# Patient Record
Sex: Female | Born: 1965 | Race: White | Hispanic: No | Smoking: Current every day smoker
Health system: Southern US, Community
[De-identification: ages and names within clinical notes are randomized; demographics above are authoritative.]

## PROBLEM LIST (undated history)

## (undated) DIAGNOSIS — I341 Nonrheumatic mitral (valve) prolapse: Secondary | ICD-10-CM

## (undated) DIAGNOSIS — F419 Anxiety disorder, unspecified: Secondary | ICD-10-CM

## (undated) DIAGNOSIS — F319 Bipolar disorder, unspecified: Secondary | ICD-10-CM

## (undated) DIAGNOSIS — S0300XA Dislocation of jaw, unspecified side, initial encounter: Secondary | ICD-10-CM

## (undated) HISTORY — PX: ABDOMINAL HYSTERECTOMY: SHX81

## (undated) HISTORY — PX: ABLATION ON ENDOMETRIOSIS: SHX5787

## (undated) HISTORY — PX: KNEE SURGERY: SHX244

## (undated) HISTORY — PX: LAPAROSCOPIC CHOLECYSTECTOMY: SUR755

---

## 2004-06-29 ENCOUNTER — Emergency Department: Payer: Self-pay | Admitting: Emergency Medicine

## 2004-07-07 ENCOUNTER — Ambulatory Visit: Payer: Self-pay | Admitting: Pain Medicine

## 2004-07-15 ENCOUNTER — Ambulatory Visit: Payer: Self-pay | Admitting: Pain Medicine

## 2004-07-17 ENCOUNTER — Ambulatory Visit: Payer: Self-pay | Admitting: Pain Medicine

## 2004-07-17 ENCOUNTER — Ambulatory Visit: Payer: Self-pay | Admitting: Physician Assistant

## 2004-07-28 ENCOUNTER — Ambulatory Visit: Payer: Self-pay | Admitting: Physician Assistant

## 2004-08-23 ENCOUNTER — Other Ambulatory Visit: Payer: Self-pay

## 2004-08-23 ENCOUNTER — Emergency Department: Payer: Self-pay | Admitting: Emergency Medicine

## 2004-08-25 ENCOUNTER — Ambulatory Visit: Payer: Self-pay | Admitting: Physician Assistant

## 2004-09-16 ENCOUNTER — Ambulatory Visit: Payer: Self-pay | Admitting: Pain Medicine

## 2004-09-22 ENCOUNTER — Ambulatory Visit: Payer: Self-pay | Admitting: Pain Medicine

## 2004-09-30 ENCOUNTER — Ambulatory Visit: Payer: Self-pay | Admitting: Physician Assistant

## 2004-10-22 ENCOUNTER — Encounter: Payer: Self-pay | Admitting: Pain Medicine

## 2004-11-12 ENCOUNTER — Encounter: Payer: Self-pay | Admitting: Pain Medicine

## 2004-12-22 ENCOUNTER — Ambulatory Visit: Payer: Self-pay | Admitting: Physician Assistant

## 2005-01-06 ENCOUNTER — Ambulatory Visit: Payer: Self-pay | Admitting: Pain Medicine

## 2005-02-17 ENCOUNTER — Emergency Department: Payer: Self-pay | Admitting: Emergency Medicine

## 2005-04-03 ENCOUNTER — Emergency Department: Payer: Self-pay | Admitting: Emergency Medicine

## 2005-04-21 ENCOUNTER — Ambulatory Visit: Payer: Self-pay | Admitting: Physician Assistant

## 2005-05-25 ENCOUNTER — Emergency Department: Payer: Self-pay | Admitting: Emergency Medicine

## 2005-05-28 ENCOUNTER — Emergency Department: Payer: Self-pay | Admitting: Emergency Medicine

## 2005-05-28 ENCOUNTER — Other Ambulatory Visit: Payer: Self-pay

## 2005-07-06 ENCOUNTER — Emergency Department: Payer: Self-pay | Admitting: Emergency Medicine

## 2005-07-09 ENCOUNTER — Other Ambulatory Visit: Payer: Self-pay

## 2005-07-09 ENCOUNTER — Observation Stay: Payer: Self-pay | Admitting: Internal Medicine

## 2005-08-17 ENCOUNTER — Ambulatory Visit: Payer: Self-pay | Admitting: Physician Assistant

## 2005-08-25 ENCOUNTER — Ambulatory Visit: Payer: Self-pay | Admitting: Pain Medicine

## 2005-09-22 ENCOUNTER — Ambulatory Visit: Payer: Self-pay | Admitting: Physician Assistant

## 2005-09-29 ENCOUNTER — Ambulatory Visit: Payer: Self-pay | Admitting: Pain Medicine

## 2005-10-12 ENCOUNTER — Ambulatory Visit: Payer: Self-pay | Admitting: Physician Assistant

## 2005-10-21 ENCOUNTER — Ambulatory Visit: Payer: Self-pay | Admitting: Family Medicine

## 2005-10-29 ENCOUNTER — Ambulatory Visit: Payer: Self-pay | Admitting: Pain Medicine

## 2005-10-30 ENCOUNTER — Ambulatory Visit: Payer: Self-pay | Admitting: Physician Assistant

## 2005-11-13 ENCOUNTER — Ambulatory Visit: Payer: Self-pay | Admitting: Physician Assistant

## 2005-11-19 ENCOUNTER — Ambulatory Visit: Payer: Self-pay | Admitting: Pain Medicine

## 2005-12-14 ENCOUNTER — Ambulatory Visit: Payer: Self-pay | Admitting: Physician Assistant

## 2006-01-13 ENCOUNTER — Emergency Department: Payer: Self-pay | Admitting: Emergency Medicine

## 2006-01-16 ENCOUNTER — Emergency Department: Payer: Self-pay | Admitting: Emergency Medicine

## 2006-02-02 ENCOUNTER — Ambulatory Visit: Payer: Self-pay | Admitting: Physician Assistant

## 2006-02-10 ENCOUNTER — Emergency Department: Payer: Self-pay | Admitting: Emergency Medicine

## 2006-03-23 ENCOUNTER — Ambulatory Visit: Payer: Self-pay | Admitting: Physician Assistant

## 2006-04-05 ENCOUNTER — Ambulatory Visit: Payer: Self-pay | Admitting: Physician Assistant

## 2006-06-16 ENCOUNTER — Emergency Department: Payer: Self-pay | Admitting: Emergency Medicine

## 2006-06-30 ENCOUNTER — Ambulatory Visit: Payer: Self-pay | Admitting: Physician Assistant

## 2006-07-12 ENCOUNTER — Ambulatory Visit: Payer: Self-pay | Admitting: Physician Assistant

## 2006-07-19 ENCOUNTER — Ambulatory Visit: Payer: Self-pay | Admitting: Pain Medicine

## 2006-07-22 ENCOUNTER — Ambulatory Visit: Payer: Self-pay | Admitting: Pain Medicine

## 2006-08-09 ENCOUNTER — Ambulatory Visit: Payer: Self-pay | Admitting: Physician Assistant

## 2006-08-17 ENCOUNTER — Ambulatory Visit: Payer: Self-pay | Admitting: Pain Medicine

## 2006-09-01 ENCOUNTER — Ambulatory Visit: Payer: Self-pay | Admitting: Pain Medicine

## 2006-09-23 ENCOUNTER — Ambulatory Visit: Payer: Self-pay | Admitting: Pain Medicine

## 2006-11-08 ENCOUNTER — Ambulatory Visit: Payer: Self-pay | Admitting: Physician Assistant

## 2006-12-17 ENCOUNTER — Ambulatory Visit: Payer: Self-pay | Admitting: Physician Assistant

## 2006-12-20 ENCOUNTER — Ambulatory Visit: Payer: Self-pay | Admitting: Physician Assistant

## 2007-01-10 ENCOUNTER — Ambulatory Visit: Payer: Self-pay | Admitting: Physician Assistant

## 2007-01-14 ENCOUNTER — Emergency Department: Payer: Self-pay | Admitting: Emergency Medicine

## 2007-03-07 ENCOUNTER — Ambulatory Visit: Payer: Self-pay | Admitting: Physician Assistant

## 2007-06-06 ENCOUNTER — Ambulatory Visit: Payer: Self-pay | Admitting: Physician Assistant

## 2007-08-15 ENCOUNTER — Emergency Department: Payer: Self-pay | Admitting: Emergency Medicine

## 2007-08-29 ENCOUNTER — Ambulatory Visit: Payer: Self-pay | Admitting: Physician Assistant

## 2007-09-10 ENCOUNTER — Emergency Department: Payer: Self-pay | Admitting: Emergency Medicine

## 2007-10-22 ENCOUNTER — Emergency Department: Payer: Self-pay | Admitting: Emergency Medicine

## 2007-10-29 ENCOUNTER — Emergency Department: Payer: Self-pay | Admitting: Internal Medicine

## 2007-11-28 ENCOUNTER — Ambulatory Visit: Payer: Self-pay | Admitting: Physician Assistant

## 2008-02-29 ENCOUNTER — Ambulatory Visit: Payer: Self-pay | Admitting: Physician Assistant

## 2008-05-30 ENCOUNTER — Ambulatory Visit: Payer: Self-pay | Admitting: Physician Assistant

## 2008-08-29 ENCOUNTER — Ambulatory Visit: Payer: Self-pay | Admitting: Physician Assistant

## 2008-09-26 ENCOUNTER — Emergency Department: Payer: Self-pay | Admitting: Emergency Medicine

## 2008-10-30 ENCOUNTER — Emergency Department: Payer: Self-pay | Admitting: Emergency Medicine

## 2008-11-27 ENCOUNTER — Ambulatory Visit: Payer: Self-pay | Admitting: Physician Assistant

## 2008-12-25 ENCOUNTER — Ambulatory Visit: Payer: Self-pay | Admitting: Pain Medicine

## 2009-02-05 ENCOUNTER — Ambulatory Visit: Payer: Self-pay | Admitting: Physician Assistant

## 2009-05-29 ENCOUNTER — Ambulatory Visit: Payer: Self-pay | Admitting: Physician Assistant

## 2009-06-20 ENCOUNTER — Emergency Department: Payer: Self-pay | Admitting: Unknown Physician Specialty

## 2009-07-11 ENCOUNTER — Emergency Department (HOSPITAL_COMMUNITY): Admission: EM | Admit: 2009-07-11 | Discharge: 2009-07-11 | Payer: Self-pay | Admitting: Emergency Medicine

## 2009-08-03 ENCOUNTER — Emergency Department: Payer: Self-pay | Admitting: Emergency Medicine

## 2009-08-27 ENCOUNTER — Ambulatory Visit: Payer: Self-pay | Admitting: Physician Assistant

## 2009-11-25 ENCOUNTER — Ambulatory Visit: Payer: Self-pay | Admitting: Pain Medicine

## 2010-02-09 ENCOUNTER — Emergency Department: Payer: Self-pay

## 2010-02-24 ENCOUNTER — Ambulatory Visit: Payer: Self-pay | Admitting: Pain Medicine

## 2010-02-24 ENCOUNTER — Emergency Department: Payer: Self-pay | Admitting: Emergency Medicine

## 2010-03-06 ENCOUNTER — Ambulatory Visit: Payer: Self-pay | Admitting: Pain Medicine

## 2010-03-31 ENCOUNTER — Emergency Department: Payer: Self-pay | Admitting: Unknown Physician Specialty

## 2010-04-07 ENCOUNTER — Emergency Department: Payer: Self-pay | Admitting: Emergency Medicine

## 2010-04-20 ENCOUNTER — Emergency Department: Payer: Self-pay | Admitting: Internal Medicine

## 2010-05-13 ENCOUNTER — Emergency Department: Payer: Self-pay | Admitting: Emergency Medicine

## 2010-07-24 ENCOUNTER — Ambulatory Visit: Payer: Self-pay | Admitting: Pain Medicine

## 2010-08-11 ENCOUNTER — Ambulatory Visit: Payer: Self-pay | Admitting: Pain Medicine

## 2010-08-19 ENCOUNTER — Ambulatory Visit: Payer: Self-pay | Admitting: Pain Medicine

## 2010-09-01 ENCOUNTER — Ambulatory Visit: Payer: Self-pay | Admitting: Pain Medicine

## 2010-09-05 ENCOUNTER — Ambulatory Visit: Payer: Self-pay | Admitting: Pain Medicine

## 2010-09-12 ENCOUNTER — Emergency Department: Payer: Self-pay | Admitting: Emergency Medicine

## 2010-09-18 ENCOUNTER — Ambulatory Visit: Payer: Self-pay | Admitting: Pain Medicine

## 2010-12-18 LAB — URINALYSIS, ROUTINE W REFLEX MICROSCOPIC
Bilirubin Urine: NEGATIVE
Nitrite: NEGATIVE
Protein, ur: NEGATIVE mg/dL
Urobilinogen, UA: 0.2 mg/dL (ref 0.0–1.0)

## 2010-12-18 LAB — URINE MICROSCOPIC-ADD ON

## 2010-12-29 ENCOUNTER — Emergency Department: Payer: Self-pay | Admitting: Internal Medicine

## 2010-12-31 ENCOUNTER — Emergency Department: Payer: Self-pay | Admitting: Emergency Medicine

## 2011-03-21 ENCOUNTER — Emergency Department: Payer: Self-pay | Admitting: Emergency Medicine

## 2011-06-07 ENCOUNTER — Emergency Department: Payer: Self-pay | Admitting: Unknown Physician Specialty

## 2011-06-15 ENCOUNTER — Emergency Department: Payer: Self-pay | Admitting: Emergency Medicine

## 2011-08-19 ENCOUNTER — Emergency Department: Payer: Self-pay | Admitting: Emergency Medicine

## 2011-09-01 ENCOUNTER — Emergency Department: Payer: Self-pay | Admitting: Emergency Medicine

## 2012-01-05 ENCOUNTER — Emergency Department: Payer: Self-pay | Admitting: Emergency Medicine

## 2012-01-05 LAB — COMPREHENSIVE METABOLIC PANEL
Albumin: 3.6 g/dL (ref 3.4–5.0)
BUN: 8 mg/dL (ref 7–18)
Calcium, Total: 9.2 mg/dL (ref 8.5–10.1)
EGFR (Non-African Amer.): >60
Osmolality: 282 (ref 275–301)
SGOT(AST): 31 U/L (ref 15–37)
Total Protein: 7.7 g/dL (ref 6.4–8.2)

## 2012-01-05 LAB — CBC
HGB: 12.5 g/dL (ref 12.0–16.0)
RBC: 4.15 10*6/uL (ref 3.80–5.20)

## 2012-03-19 IMAGING — CR DG CHEST 2V
1 series · 2 of 2 positions shown · non-contrast
Comparison: none

REASON FOR EXAM: chest pain
COMMENTS:   May transport without cardiac monitor

PROCEDURE:     DXR - DXR CHEST PA (OR AP) AND LATERAL  - April 20, 2010  [DATE]
RESULT:     Comparison: None

[Series 1: view not recorded · 0.17mm/px · 2 of 2 slices shown]
[im 1/2]
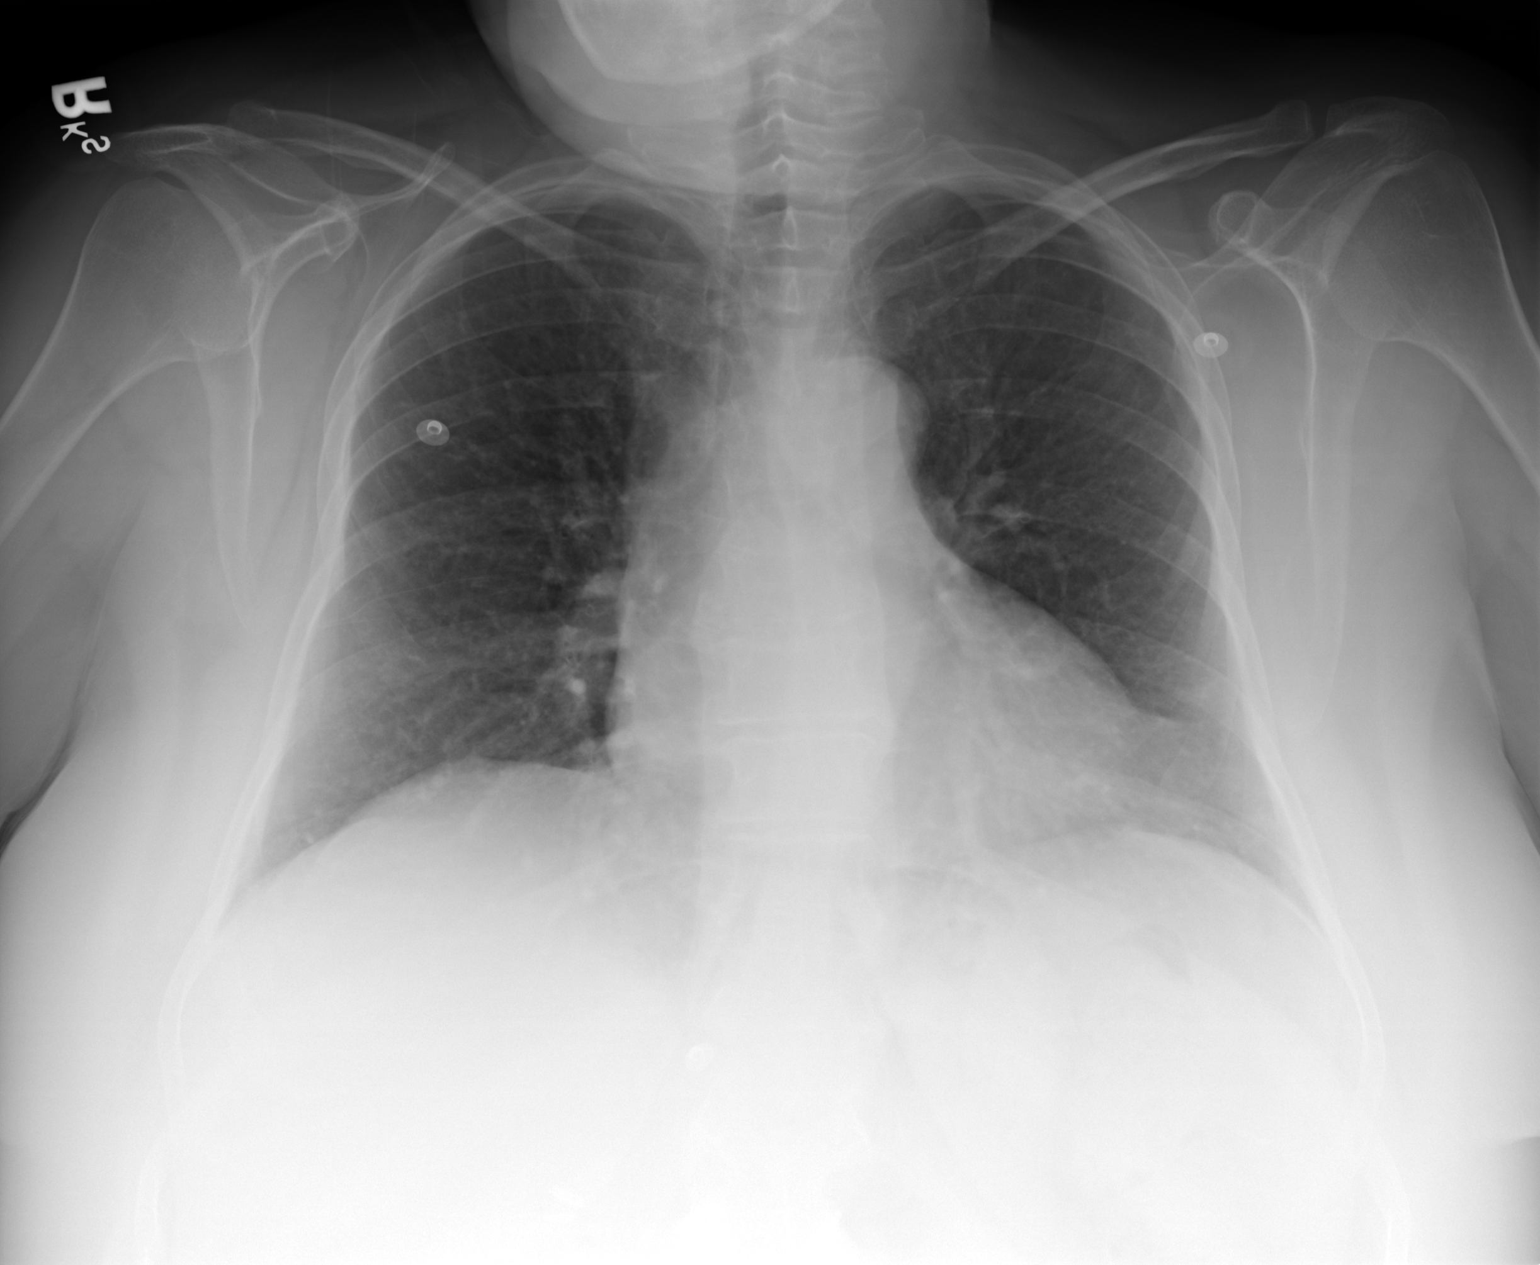
[im 2/2]
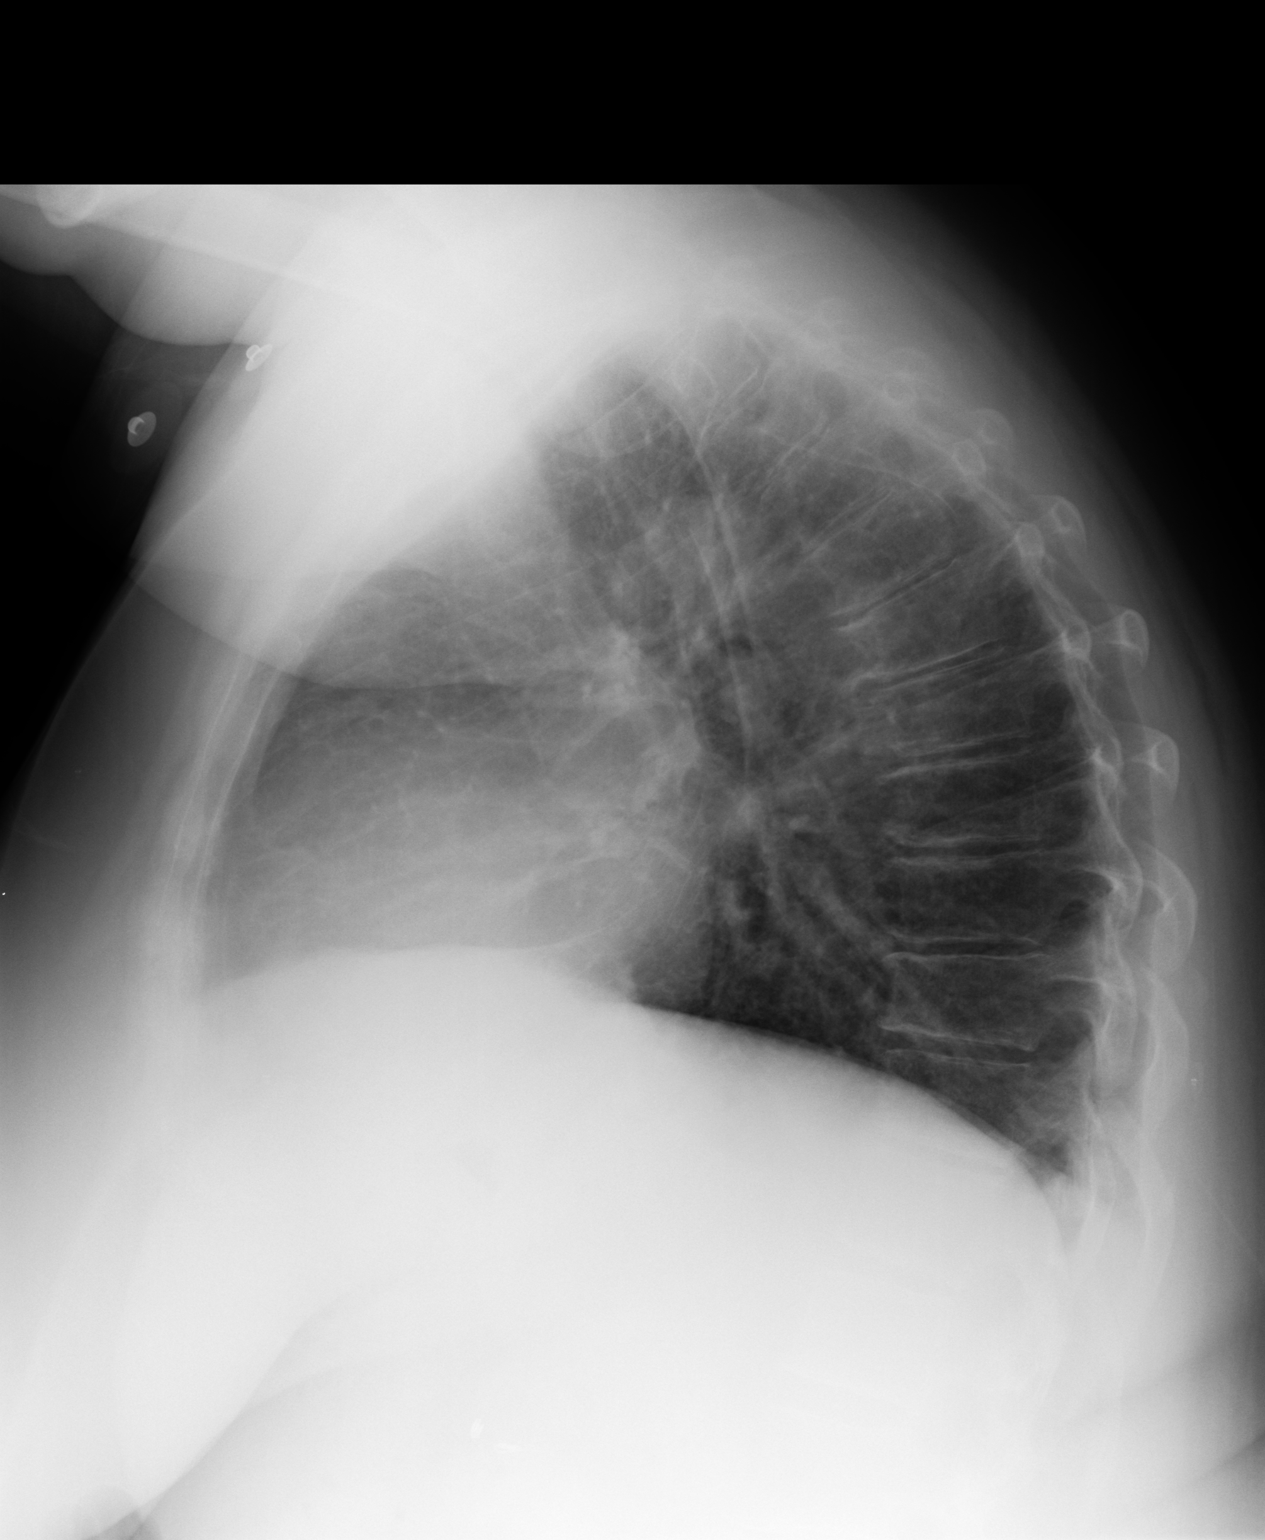

[2 of 2 positions shown; findings below may reference images not displayed]

FINDINGS: PA and lateral chest radiographs are provided. There is no focal parenchymal
opacity, pleural effusion, or pneumothorax. The heart and mediastinum are
unremarkable. The osseous structures are unremarkable.
IMPRESSION: No acute disease of the chest.

## 2012-03-19 IMAGING — CT CT CHEST W/ CM
1 series · 15 of 32 positions shown, 19 images · IV contrast (APPLIED)
Comparison: None

REASON FOR EXAM: chest pain
COMMENTS:

PROCEDURE:     CT  - CT CHEST (FOR PE) W  - April 20, 2010  [DATE]
RESULT:     Indications: Chest pain
TECHNIQUE: A thin-section spiral CT from the lung apices to the upper
abdomen was acquired on a multi slice scanner following 100ml [id number]-M2M
intravenous contrast. These images were then transferred to the Siemens work
station and were subsequently reviewed utilizing 3-D reconstructions and MIP
images.

[Series 4: soft tissue · axial · 0.79mm/px · z∈[-300,-39]mm · 15 of 99 slices shown, 19 images]
[im 8/99  mediastinal]
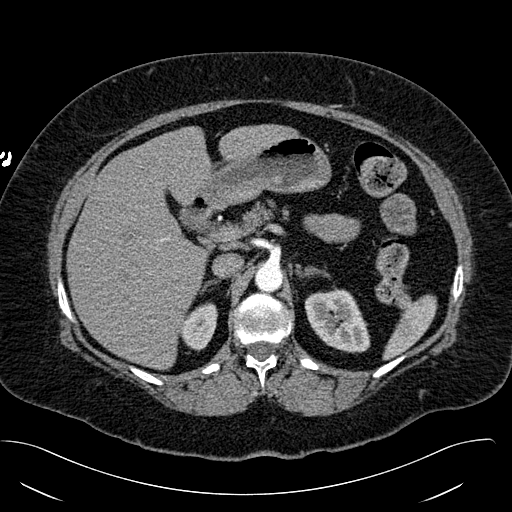
[im 8/99  lung]
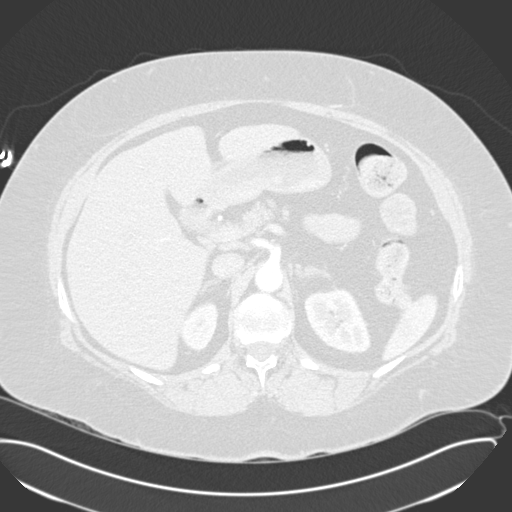
[im 15/99  lung]
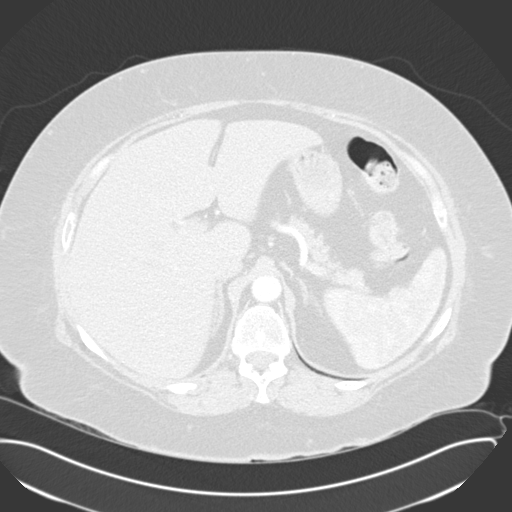
[im 20/99  lung]
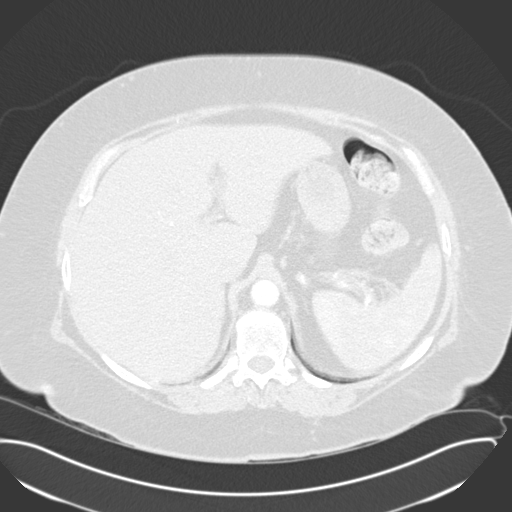
[im 26/99  lung]
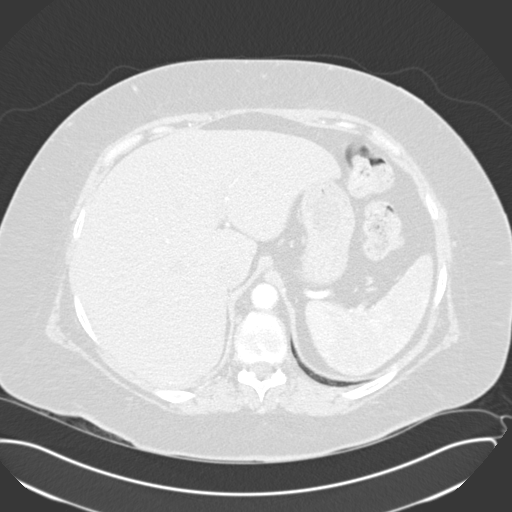
[im 33/99  mediastinal]
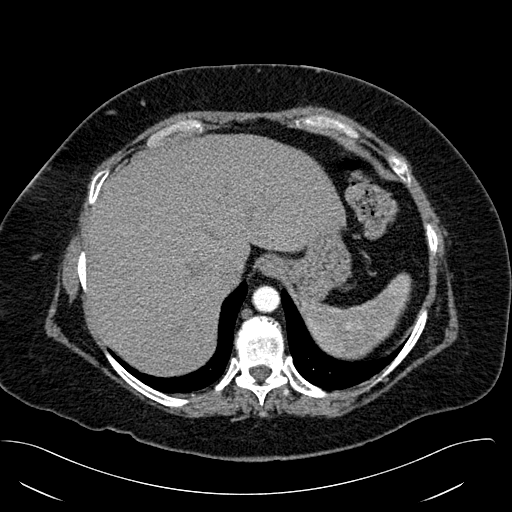
[im 33/99  lung]
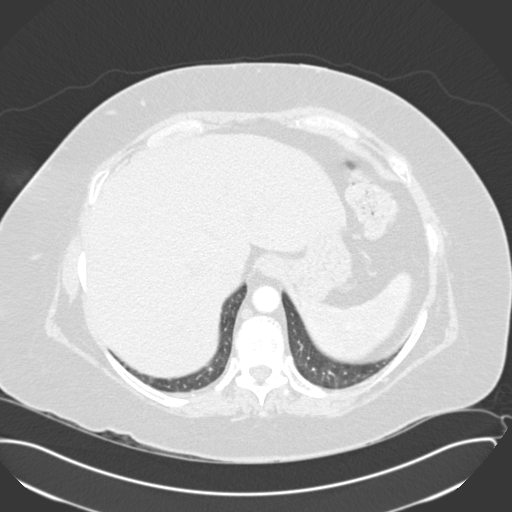
[im 40/99  lung]
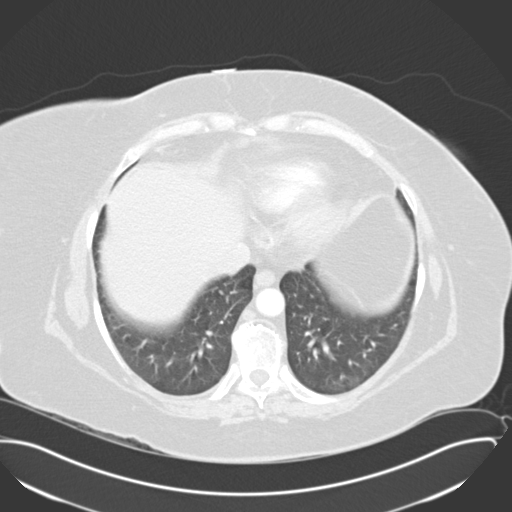
[im 47/99  lung]
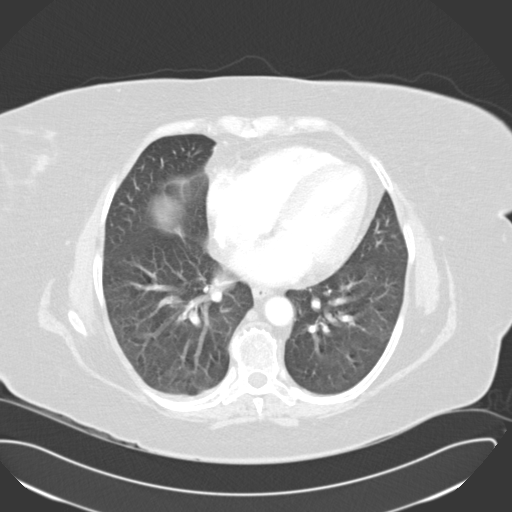
[im 51/99  lung]
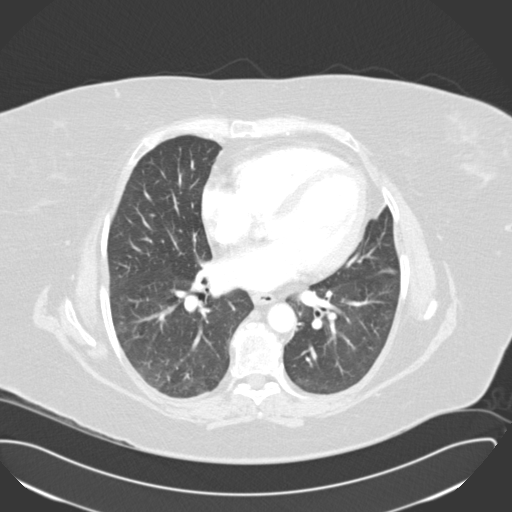
[im 55/99  mediastinal]
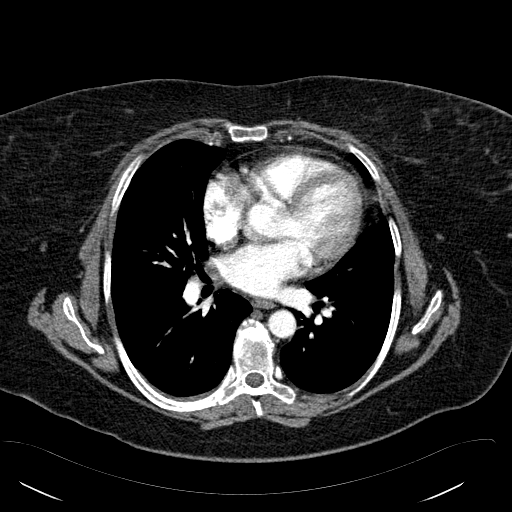
[im 55/99  lung]
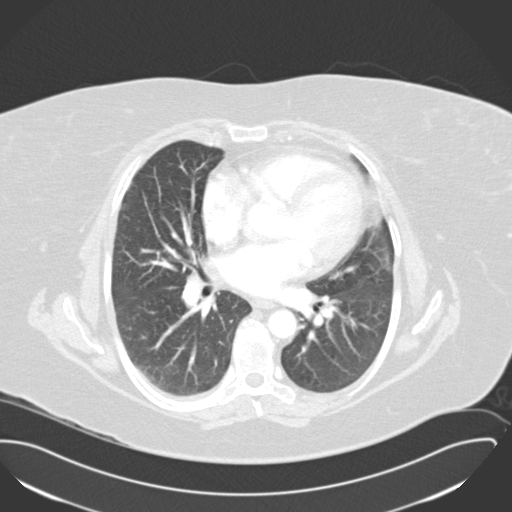
[im 62/99  lung]
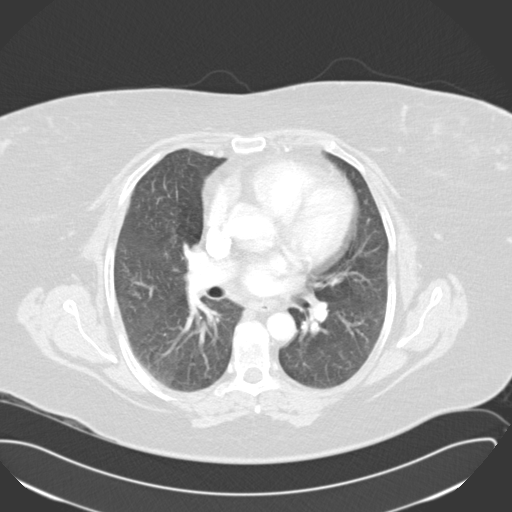
[im 69/99  lung]
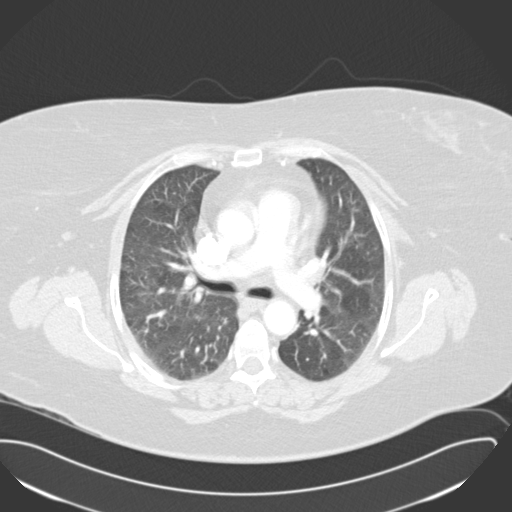
[im 77/99  lung]
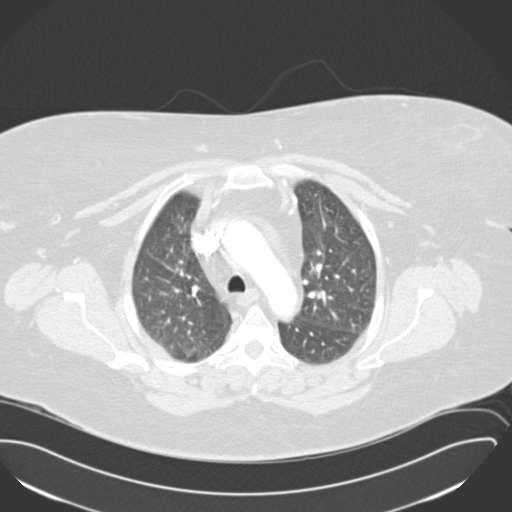
[im 80/99  mediastinal]
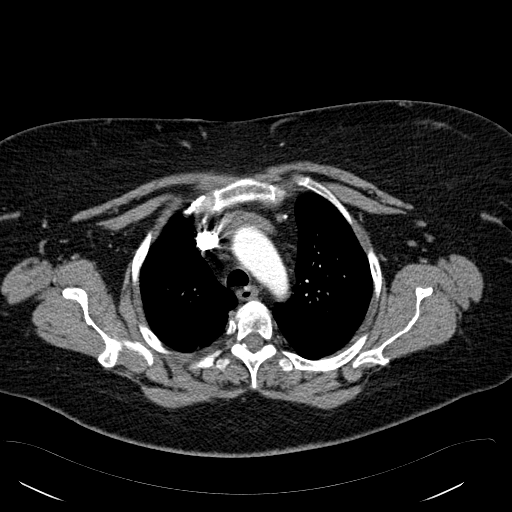
[im 80/99  lung]
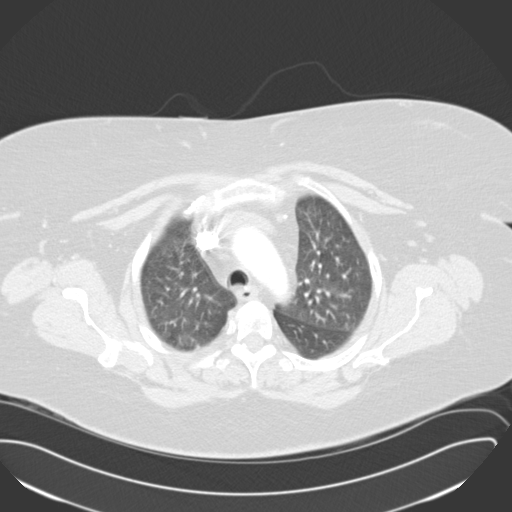
[im 88/99  lung]
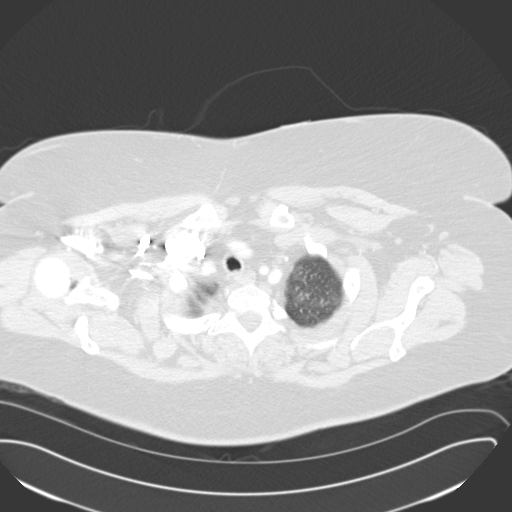
[im 95/99  lung]
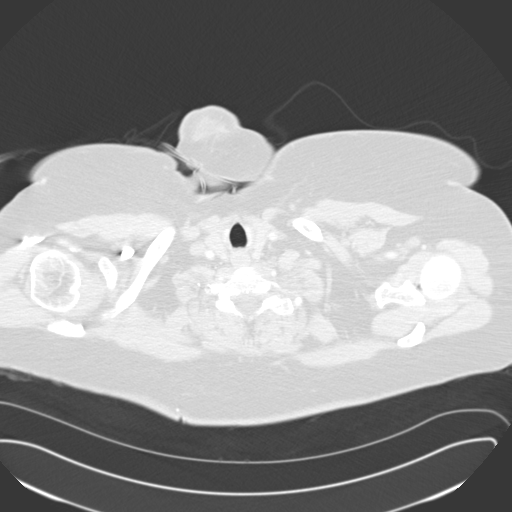

[15 of 32 positions shown; findings below may reference images not displayed]

FINDINGS: There is adequate opacification of the central and segmental pulmonary
arteries. The subsegmental pulmonary arteries are suboptimally evaluated
secondary to respiratory motion degrading image quality. There is no
pulmonary embolus. The main pulmonary artery, right main pulmonary artery,
and left main pulmonary arteries are normal in size. The heart size is
normal. There is no pericardial effusion.

The lungs are clear. There is no focal consolidation, pleural effusion, or
pneumothorax.

There is no axillary, hilar, or mediastinal adenopathy.

The osseous structures are unremarkable.

There is a right renal calculus.
IMPRESSION: 1. No CT evidence of a central or segmental pulmonary embolus. The
subsegmental pulmonary artery branches are suboptimally evaluated secondary
to respiratory motion.

2. Right renal calculus.

## 2012-03-24 ENCOUNTER — Emergency Department: Payer: Self-pay | Admitting: Emergency Medicine

## 2012-05-17 ENCOUNTER — Emergency Department: Payer: Self-pay | Admitting: *Deleted

## 2012-07-13 ENCOUNTER — Emergency Department: Payer: Self-pay | Admitting: Emergency Medicine

## 2012-11-05 ENCOUNTER — Emergency Department: Payer: Self-pay | Admitting: Emergency Medicine

## 2012-11-21 ENCOUNTER — Emergency Department: Payer: Self-pay | Admitting: Emergency Medicine

## 2012-12-07 ENCOUNTER — Emergency Department: Payer: Self-pay | Admitting: Emergency Medicine

## 2012-12-07 LAB — BASIC METABOLIC PANEL
BUN: 10 mg/dL (ref 7–18)
Calcium, Total: 8.5 mg/dL (ref 8.5–10.1)
Co2: 25 mmol/L (ref 21–32)
Creatinine: 0.89 mg/dL (ref 0.60–1.30)
Osmolality: 274 (ref 275–301)
Potassium: 3.7 mmol/L (ref 3.5–5.1)
Sodium: 138 mmol/L (ref 136–145)

## 2012-12-07 LAB — URINALYSIS, COMPLETE
Ph: 5 (ref 4.5–8.0)
Protein: NEGATIVE
Specific Gravity: 1.028 (ref 1.003–1.030)

## 2012-12-07 LAB — CBC WITH DIFFERENTIAL/PLATELET
Eosinophil #: 0.3 10*3/uL (ref 0.0–0.7)
Eosinophil %: 3.1 %
HGB: 12.1 g/dL (ref 12.0–16.0)
Lymphocyte %: 32.9 %
MCHC: 34 g/dL (ref 32.0–36.0)
MCV: 90 fL (ref 80–100)
Monocyte #: 0.6 x10 3/mm (ref 0.2–0.9)
Monocyte %: 5.1 %
Neutrophil %: 57.9 %
RBC: 3.97 10*6/uL (ref 3.80–5.20)

## 2012-12-07 LAB — CK TOTAL AND CKMB (NOT AT ARMC): CK, Total: 36 U/L (ref 21–215)

## 2012-12-26 ENCOUNTER — Emergency Department: Payer: Self-pay | Admitting: Emergency Medicine

## 2013-02-07 ENCOUNTER — Emergency Department: Payer: Self-pay | Admitting: Emergency Medicine

## 2013-06-23 ENCOUNTER — Emergency Department: Payer: Self-pay | Admitting: Emergency Medicine

## 2013-06-23 LAB — BASIC METABOLIC PANEL
BUN: 5 mg/dL — ABNORMAL LOW (ref 7–18)
Calcium, Total: 8.9 mg/dL (ref 8.5–10.1)
Co2: 23 mmol/L (ref 21–32)
Creatinine: 0.76 mg/dL (ref 0.60–1.30)
EGFR (African American): >60
Glucose: 97 mg/dL (ref 65–99)
Osmolality: 277 (ref 275–301)
Sodium: 140 mmol/L (ref 136–145)

## 2013-06-23 LAB — URINALYSIS, COMPLETE
Blood: NEGATIVE
Glucose,UR: NEGATIVE mg/dL (ref 0–75)
Leukocyte Esterase: NEGATIVE
Nitrite: NEGATIVE
Ph: 5 (ref 4.5–8.0)
Protein: NEGATIVE

## 2013-06-23 LAB — CBC
HGB: 13.1 g/dL (ref 12.0–16.0)
MCH: 30.9 pg (ref 26.0–34.0)
MCHC: 34.9 g/dL (ref 32.0–36.0)
MCV: 88 fL (ref 80–100)
RBC: 4.24 10*6/uL (ref 3.80–5.20)
RDW: 13.7 % (ref 11.5–14.5)
WBC: 12.3 10*3/uL — ABNORMAL HIGH (ref 3.6–11.0)

## 2013-06-23 LAB — TROPONIN I: Troponin-I: <0.02 ng/mL

## 2013-06-24 LAB — LIPASE, BLOOD: Lipase: 97 U/L (ref 73–393)

## 2013-10-13 ENCOUNTER — Emergency Department: Payer: Self-pay | Admitting: Emergency Medicine

## 2013-10-13 LAB — COMPREHENSIVE METABOLIC PANEL
ALBUMIN: 4 g/dL (ref 3.4–5.0)
ALT: 26 U/L (ref 12–78)
ANION GAP: 7 (ref 7–16)
AST: 31 U/L (ref 15–37)
Alkaline Phosphatase: 106 U/L
BILIRUBIN TOTAL: 0.4 mg/dL (ref 0.2–1.0)
BUN: 8 mg/dL (ref 7–18)
CHLORIDE: 108 mmol/L — AB (ref 98–107)
CREATININE: 0.84 mg/dL (ref 0.60–1.30)
Calcium, Total: 9.1 mg/dL (ref 8.5–10.1)
Co2: 25 mmol/L (ref 21–32)
EGFR (Non-African Amer.): >60
GLUCOSE: 94 mg/dL (ref 65–99)
OSMOLALITY: 277 (ref 275–301)
Potassium: 3.7 mmol/L (ref 3.5–5.1)
Sodium: 140 mmol/L (ref 136–145)
TOTAL PROTEIN: 7.7 g/dL (ref 6.4–8.2)

## 2013-10-13 LAB — URINALYSIS, COMPLETE
BILIRUBIN, UR: NEGATIVE
Bacteria: NONE SEEN
Blood: NEGATIVE
Glucose,UR: NEGATIVE mg/dL (ref 0–75)
KETONE: NEGATIVE
Leukocyte Esterase: NEGATIVE
Nitrite: NEGATIVE
Ph: 5 (ref 4.5–8.0)
Protein: NEGATIVE
RBC,UR: 1 /HPF (ref 0–5)
SPECIFIC GRAVITY: 1.027 (ref 1.003–1.030)
Squamous Epithelial: 11
WBC UR: 3 /HPF (ref 0–5)

## 2013-10-13 LAB — CBC WITH DIFFERENTIAL/PLATELET
BASOS ABS: 0 10*3/uL (ref 0.0–0.1)
BASOS PCT: 0.7 %
Eosinophil #: 0.3 10*3/uL (ref 0.0–0.7)
Eosinophil %: 5.1 %
HCT: 37.9 % (ref 35.0–47.0)
HGB: 13.2 g/dL (ref 12.0–16.0)
LYMPHS PCT: 40.8 %
Lymphocyte #: 2.4 10*3/uL (ref 1.0–3.6)
MCH: 31.7 pg (ref 26.0–34.0)
MCHC: 34.9 g/dL (ref 32.0–36.0)
MCV: 91 fL (ref 80–100)
Monocyte #: 0.6 x10 3/mm (ref 0.2–0.9)
Monocyte %: 9.7 %
NEUTROS PCT: 43.7 %
Neutrophil #: 2.5 10*3/uL (ref 1.4–6.5)
PLATELETS: 262 10*3/uL (ref 150–440)
RBC: 4.18 10*6/uL (ref 3.80–5.20)
RDW: 13.4 % (ref 11.5–14.5)
WBC: 5.8 10*3/uL (ref 3.6–11.0)

## 2013-10-13 LAB — LIPASE, BLOOD: Lipase: 70 U/L — ABNORMAL LOW (ref 73–393)

## 2014-04-02 ENCOUNTER — Emergency Department: Payer: Self-pay | Admitting: Emergency Medicine

## 2014-04-15 ENCOUNTER — Emergency Department: Payer: Self-pay | Admitting: Emergency Medicine

## 2014-05-09 ENCOUNTER — Emergency Department: Payer: Self-pay | Admitting: Emergency Medicine

## 2014-05-09 LAB — CBC WITH DIFFERENTIAL/PLATELET
Basophil #: 0 10*3/uL (ref 0.0–0.1)
Basophil %: 0.3 %
Eosinophil #: 0.3 10*3/uL (ref 0.0–0.7)
Eosinophil %: 2.8 %
HCT: 36.1 % (ref 35.0–47.0)
HGB: 12.1 g/dL (ref 12.0–16.0)
LYMPHS PCT: 28.2 %
Lymphocyte #: 3.3 10*3/uL (ref 1.0–3.6)
MCH: 31.4 pg (ref 26.0–34.0)
MCHC: 33.5 g/dL (ref 32.0–36.0)
MCV: 94 fL (ref 80–100)
Monocyte #: 0.5 x10 3/mm (ref 0.2–0.9)
Monocyte %: 4.4 %
NEUTROS PCT: 64.3 %
Neutrophil #: 7.6 10*3/uL — ABNORMAL HIGH (ref 1.4–6.5)
PLATELETS: 272 10*3/uL (ref 150–440)
RBC: 3.85 10*6/uL (ref 3.80–5.20)
RDW: 12.5 % (ref 11.5–14.5)
WBC: 11.9 10*3/uL — ABNORMAL HIGH (ref 3.6–11.0)

## 2014-05-09 LAB — URINALYSIS, COMPLETE
Bilirubin,UR: NEGATIVE
GLUCOSE, UR: NEGATIVE mg/dL (ref 0–75)
Hyaline Cast: 10
Ketone: NEGATIVE
Leukocyte Esterase: NEGATIVE
Nitrite: NEGATIVE
PH: 5 (ref 4.5–8.0)
SPECIFIC GRAVITY: 1.031 (ref 1.003–1.030)
Squamous Epithelial: 11
WBC UR: 6 /HPF (ref 0–5)

## 2014-05-09 LAB — COMPREHENSIVE METABOLIC PANEL
ALBUMIN: 3.9 g/dL (ref 3.4–5.0)
ALK PHOS: 86 U/L
ANION GAP: 10 (ref 7–16)
BILIRUBIN TOTAL: 0.3 mg/dL (ref 0.2–1.0)
BUN: 13 mg/dL (ref 7–18)
CALCIUM: 8.9 mg/dL (ref 8.5–10.1)
CO2: 22 mmol/L (ref 21–32)
Chloride: 111 mmol/L — ABNORMAL HIGH (ref 98–107)
Creatinine: 1.08 mg/dL (ref 0.60–1.30)
EGFR (Non-African Amer.): >60
GLUCOSE: 141 mg/dL — AB (ref 65–99)
OSMOLALITY: 287 (ref 275–301)
POTASSIUM: 3.7 mmol/L (ref 3.5–5.1)
SGOT(AST): 22 U/L (ref 15–37)
SGPT (ALT): 18 U/L
Sodium: 143 mmol/L (ref 136–145)
Total Protein: 7.6 g/dL (ref 6.4–8.2)

## 2014-05-09 LAB — TROPONIN I: Troponin-I: <0.02 ng/mL

## 2014-05-13 ENCOUNTER — Emergency Department: Payer: Self-pay | Admitting: Emergency Medicine

## 2014-05-13 LAB — CBC WITH DIFFERENTIAL/PLATELET
BASOS ABS: 0 10*3/uL (ref 0.0–0.1)
BASOS PCT: 0.6 %
EOS ABS: 0.3 10*3/uL (ref 0.0–0.7)
Eosinophil %: 3.4 %
HCT: 34.2 % — ABNORMAL LOW (ref 35.0–47.0)
HGB: 11.6 g/dL — ABNORMAL LOW (ref 12.0–16.0)
LYMPHS ABS: 2.3 10*3/uL (ref 1.0–3.6)
Lymphocyte %: 29.2 %
MCH: 31.5 pg (ref 26.0–34.0)
MCHC: 33.9 g/dL (ref 32.0–36.0)
MCV: 93 fL (ref 80–100)
MONO ABS: 0.3 x10 3/mm (ref 0.2–0.9)
Monocyte %: 4 %
Neutrophil #: 4.9 10*3/uL (ref 1.4–6.5)
Neutrophil %: 62.8 %
PLATELETS: 257 10*3/uL (ref 150–440)
RBC: 3.68 10*6/uL — ABNORMAL LOW (ref 3.80–5.20)
RDW: 12.8 % (ref 11.5–14.5)
WBC: 7.8 10*3/uL (ref 3.6–11.0)

## 2014-05-13 LAB — URINALYSIS, COMPLETE
BILIRUBIN, UR: NEGATIVE
Glucose,UR: NEGATIVE mg/dL (ref 0–75)
Ketone: NEGATIVE
Leukocyte Esterase: NEGATIVE
NITRITE: NEGATIVE
PROTEIN: NEGATIVE
Ph: 5 (ref 4.5–8.0)
RBC,UR: 1 /HPF (ref 0–5)
Specific Gravity: 1.005 (ref 1.003–1.030)
Squamous Epithelial: 15
WBC UR: 2 /HPF (ref 0–5)

## 2014-08-04 ENCOUNTER — Emergency Department: Payer: Self-pay | Admitting: Emergency Medicine

## 2014-08-04 LAB — CBC WITH DIFFERENTIAL/PLATELET
BASOS PCT: 0.6 %
Basophil #: 0.1 10*3/uL (ref 0.0–0.1)
EOS ABS: 0.5 10*3/uL (ref 0.0–0.7)
EOS PCT: 3 %
HCT: 39 % (ref 35.0–47.0)
HGB: 13.1 g/dL (ref 12.0–16.0)
Lymphocyte #: 4.7 10*3/uL — ABNORMAL HIGH (ref 1.0–3.6)
Lymphocyte %: 29.9 %
MCH: 31.6 pg (ref 26.0–34.0)
MCHC: 33.6 g/dL (ref 32.0–36.0)
MCV: 94 fL (ref 80–100)
MONOS PCT: 5.7 %
Monocyte #: 0.9 x10 3/mm (ref 0.2–0.9)
NEUTROS PCT: 60.8 %
Neutrophil #: 9.5 10*3/uL — ABNORMAL HIGH (ref 1.4–6.5)
Platelet: 336 10*3/uL (ref 150–440)
RBC: 4.15 10*6/uL (ref 3.80–5.20)
RDW: 13 % (ref 11.5–14.5)
WBC: 15.6 10*3/uL — ABNORMAL HIGH (ref 3.6–11.0)

## 2014-08-04 LAB — DRUG SCREEN, URINE
AMPHETAMINES, UR SCREEN: NEGATIVE (ref ?–1000)
Barbiturates, Ur Screen: NEGATIVE (ref ?–200)
Benzodiazepine, Ur Scrn: POSITIVE (ref ?–200)
COCAINE METABOLITE, UR ~~LOC~~: NEGATIVE (ref ?–300)
Cannabinoid 50 Ng, Ur ~~LOC~~: POSITIVE (ref ?–50)
MDMA (Ecstasy)Ur Screen: NEGATIVE (ref ?–500)
Methadone, Ur Screen: NEGATIVE (ref ?–300)
Opiate, Ur Screen: NEGATIVE (ref ?–300)
PHENCYCLIDINE (PCP) UR S: NEGATIVE (ref ?–25)
Tricyclic, Ur Screen: NEGATIVE (ref ?–1000)

## 2014-08-04 LAB — COMPREHENSIVE METABOLIC PANEL
ALK PHOS: 112 U/L
ALT: 38 U/L
Albumin: 3.7 g/dL (ref 3.4–5.0)
Anion Gap: 7 (ref 7–16)
BUN: 7 mg/dL (ref 7–18)
Bilirubin,Total: 0.5 mg/dL (ref 0.2–1.0)
Calcium, Total: 8.4 mg/dL — ABNORMAL LOW (ref 8.5–10.1)
Chloride: 107 mmol/L (ref 98–107)
Co2: 26 mmol/L (ref 21–32)
Creatinine: 0.79 mg/dL (ref 0.60–1.30)
Glucose: 123 mg/dL — ABNORMAL HIGH (ref 65–99)
Osmolality: 279 (ref 275–301)
POTASSIUM: 3.2 mmol/L — AB (ref 3.5–5.1)
SGOT(AST): 49 U/L — ABNORMAL HIGH (ref 15–37)
Sodium: 140 mmol/L (ref 136–145)
TOTAL PROTEIN: 7.6 g/dL (ref 6.4–8.2)

## 2014-08-04 LAB — SEDIMENTATION RATE: ERYTHROCYTE SED RATE: 39 mm/h — AB (ref 0–20)

## 2014-08-23 ENCOUNTER — Emergency Department: Payer: Self-pay | Admitting: Emergency Medicine

## 2014-12-09 ENCOUNTER — Emergency Department: Payer: Self-pay | Admitting: Internal Medicine

## 2014-12-09 LAB — COMPREHENSIVE METABOLIC PANEL
ALBUMIN: 4.5 g/dL
ALK PHOS: 100 U/L
ALT: 14 U/L
ANION GAP: 6 — AB (ref 7–16)
BUN: 16 mg/dL
CREATININE: 0.75 mg/dL
Calcium, Total: 8.9 mg/dL
Chloride: 107 mmol/L
Co2: 24 mmol/L
EGFR (African American): >60
EGFR (Non-African Amer.): >60
Glucose: 98 mg/dL
Potassium: 4 mmol/L
SGOT(AST): 15 U/L
Sodium: 137 mmol/L
TOTAL PROTEIN: 7.6 g/dL

## 2014-12-09 LAB — CBC
HCT: 37.3 % (ref 35.0–47.0)
HGB: 12.8 g/dL (ref 12.0–16.0)
MCH: 31.8 pg (ref 26.0–34.0)
MCHC: 34.2 g/dL (ref 32.0–36.0)
MCV: 93 fL (ref 80–100)
Platelet: 327 10*3/uL (ref 150–440)
RBC: 4.01 10*6/uL (ref 3.80–5.20)
RDW: 13.3 % (ref 11.5–14.5)
WBC: 10.6 10*3/uL (ref 3.6–11.0)

## 2014-12-09 LAB — URINALYSIS, COMPLETE
BILIRUBIN, UR: NEGATIVE
Bacteria: NONE SEEN
Blood: NEGATIVE
Glucose,UR: NEGATIVE mg/dL (ref 0–75)
KETONE: NEGATIVE
Nitrite: NEGATIVE
PH: 5 (ref 4.5–8.0)
Protein: NEGATIVE
SPECIFIC GRAVITY: 1.02 (ref 1.003–1.030)

## 2014-12-30 ENCOUNTER — Emergency Department
Admit: 2014-12-30 | Disposition: A | Discharge status: Home / Self Care | Payer: Self-pay | Admitting: Emergency Medicine

## 2015-04-15 ENCOUNTER — Ambulatory Visit: Payer: Self-pay | Attending: Oncology

## 2015-10-05 ENCOUNTER — Emergency Department
Admission: EM | Admit: 2015-10-05 | Discharge: 2015-10-05 | Disposition: A | Discharge status: Home / Self Care | Payer: Self-pay | Attending: Emergency Medicine | Admitting: Emergency Medicine

## 2015-10-05 ENCOUNTER — Emergency Department: Payer: Self-pay

## 2015-10-05 ENCOUNTER — Encounter: Payer: Self-pay | Admitting: *Deleted

## 2015-10-05 DIAGNOSIS — M7122 Synovial cyst of popliteal space [Baker], left knee: Secondary | ICD-10-CM | POA: Insufficient documentation

## 2015-10-05 DIAGNOSIS — M25462 Effusion, left knee: Secondary | ICD-10-CM

## 2015-10-05 DIAGNOSIS — F17210 Nicotine dependence, cigarettes, uncomplicated: Secondary | ICD-10-CM | POA: Insufficient documentation

## 2015-10-05 DIAGNOSIS — M25562 Pain in left knee: Secondary | ICD-10-CM

## 2015-10-05 HISTORY — DX: Anxiety disorder, unspecified: F41.9

## 2015-10-05 HISTORY — DX: Bipolar disorder, unspecified: F31.9

## 2015-10-05 MED ORDER — IBUPROFEN 600 MG PO TABS: 600.0000 mg | ORAL_TABLET | Freq: Three times a day (TID) | ORAL | Status: DC | PRN

## 2015-10-05 NOTE — Discharge Instructions (Signed)
Please seek medical attention for any high fevers, chest pain, shortness of breath, change in behavior, persistent vomiting, bloody stool or any other new or concerning symptoms.   Baker Cyst A Baker cyst is a sac-like structure that forms in the back of the knee. It is filled with the same fluid that is located in your knee. This fluid lubricates the bones and cartilage of the knee and allows them to move over each other more easily. CAUSES  When the knee becomes injured or inflamed, increased fluid forms in the knee. When this happens, the joint lining is pushed out behind the knee and forms the Baker cyst. This cyst may also be caused by inflammation from arthritic conditions and infections. SIGNS AND SYMPTOMS  A Baker cyst usually has no symptoms. When the cyst is substantially enlarged:  You may feel pressure behind the knee, stiffness in the knee, or a mass in the area behind the knee.  You may develop pain, redness, and swelling in the calf. This can suggest a blood clot and requires evaluation by your health care provider. DIAGNOSIS  A Baker cyst is most often found during an ultrasound exam. This exam may have been performed for other reasons, and the cyst was found incidentally. Sometimes an MRI is used. This picks up other problems within a joint that an ultrasound exam may not. If the Baker cyst developed immediately after an injury, X-ray exams may be used to diagnose the cyst. TREATMENT  The treatment depends on the cause of the cyst. Anti-inflammatory medicines and rest often will be prescribed. If the cyst is caused by a bacterial infection, antibiotic medicines may be prescribed.  HOME CARE INSTRUCTIONS   If the cyst was caused by an injury, for the first 24 hours, keep the injured leg elevated on 2 pillows while lying down.  For the first 24 hours while you are awake, apply ice to the injured area:  Put ice in a plastic bag.  Place a towel between your skin and the  bag.  Leave the ice on for 20 minutes, 2-3 times a day.  Only take over-the-counter or prescription medicines for pain, discomfort, or fever as directed by your health care provider.  Only take antibiotic medicine as directed. Make sure to finish it even if you start to feel better. MAKE SURE YOU:   Understand these instructions.  Will watch your condition.  Will get help right away if you are not doing well or get worse.   This information is not intended to replace advice given to you by your health care provider. Make sure you discuss any questions you have with your health care provider.   Document Released: 08/31/2005 Document Revised: 06/21/2013 Document Reviewed: 04/12/2013 Elsevier Interactive Patient Education 2016 [employer] RICE for Routine Care of Injuries Theroutine careofmanyinjuriesincludes rest, ice, compression, and elevation (RICE therapy). RICE therapy is often recommended for injuries to soft tissues, such as a muscle strain, ligament injuries, bruises, and overuse injuries. It can also be used for some bony injuries. Using RICE therapy can help to relieve pain, lessen swelling, and enable your body to heal. Rest Rest is required to allow your body to heal. This usually involves reducing your normal activities and avoiding use of the injured part of your body. Generally, you can return to your normal activities when you are comfortable and have been given permission by your health care provider. Ice Icing your injury helps to keep the swelling down, and it lessens pain.  Do not apply ice directly to your skin.  Put ice in a plastic bag.  Place a towel between your skin and the bag.  Leave the ice on for 20 minutes, 2-3 times a day. Do this for as long as you are directed by your health care provider. Compression Compression means putting pressure on the injured area. Compression helps to keep swelling down, gives support, and helps with discomfort.  Compression may be done with an elastic bandage. If an elastic bandage has been applied, follow these general tips:  Remove and reapply the bandage every 3-4 hours or as directed by your health care provider.  Make sure the bandage is not wrapped too tightly, because this can cut off circulation. If part of your body beyond the bandage becomes blue, numb, cold, swollen, or more painful, your bandage is most likely too tight. If this occurs, remove your bandage and reapply it more loosely.  See your health care provider if the bandage seems to be making your problems worse rather than better. Elevation Elevation means keeping the injured area raised. This helps to lessen swelling and decrease pain. If possible, your injured area should be elevated at or above the level of your heart or the center of your chest. WHEN SHOULD I SEEK MEDICAL CARE? You should seek medical care if:  Your pain and swelling continue.  Your symptoms are getting worse rather than improving. These symptoms may indicate that further evaluation or further X-rays are needed. Sometimes, X-rays may not show a small broken bone (fracture) until a number of days later. Make a follow-up appointment with your health care provider. WHEN SHOULD I SEEK IMMEDIATE MEDICAL CARE? You should seek immediate medical care if:  You have sudden severe pain at or below the area of your injury.  You have redness or increased swelling around your injury.  You have tingling or numbness at or below the area of your injury that does not improve after you remove the elastic bandage.   This information is not intended to replace advice given to you by your health care provider. Make sure you discuss any questions you have with your health care provider.   Document Released: 12/13/2000 Document Revised: 05/22/2015 Document Reviewed: 08/08/2014 Elsevier Interactive Patient Education [employer]

## 2015-10-05 NOTE — ED Provider Notes (Signed)
[facility] Emergency Department Provider Note   ____________________________________________  Time seen: 0300  I have reviewed the triage vital signs and the nursing notes.   HISTORY  Chief Complaint Knee Pain   History limited by: Not Limited   HPI Madison Carroll is a 50 y.o. female who presents to the emergency department today because of left knee pain. She states that it started two days ago. It has been constant and gradually getting a little worse. It has been accompanied by some joint swelling. She denies any direct trauma to the knee but thinks she might have twisted it. No fevers. No nausea or vomiting. She tried taking tylenol without any significant relief.     Past Medical History  Diagnosis Date  . Bipolar 1 disorder (HCC)   . Anxiety     There are no active problems to display for this patient.   History reviewed. No pertinent past surgical history.  No current outpatient prescriptions on file.  Allergies Codeine and Sulfa antibiotics  History reviewed. No pertinent family history.  Social History Social History  Substance Use Topics  . Smoking status: Current Every Day Smoker    Types: Cigarettes  . Smokeless tobacco: Never Used  . Alcohol Use: No    Review of Systems  Constitutional: Negative for fever. Cardiovascular: Negative for chest pain. Respiratory: Negative for shortness of breath. Gastrointestinal: Negative for abdominal pain, vomiting and diarrhea. Neurological: Negative for headaches, focal weakness or numbness.   10-point ROS otherwise negative.  ____________________________________________   PHYSICAL EXAM:  VITAL SIGNS: ED Triage Vitals  Enc Vitals Group     BP 10/05/15 0127 115/82 mmHg     Pulse Rate 10/05/15 0127 84     Resp 10/05/15 0127 16     Temp 10/05/15 0127 98.3 F (36.8 C)     Temp Source 10/05/15 0127 Oral     SpO2 10/05/15 0127 95 %     Weight 10/05/15 0127 175 lb (79.379 kg)     Height 10/05/15 0127  (1.6 m)     Head Cir --      Peak Flow --      Pain Score 10/05/15 0128 7   Constitutional: Alert and oriented. Well appearing and in no distress. Eyes: Conjunctivae are normal. PERRL. Normal extraocular movements. ENT   Head: Normocephalic and atraumatic.   Nose: No congestion/rhinnorhea.   Mouth/Throat: Mucous membranes are moist.   Neck: No stridor. Hematological/Lymphatic/Immunilogical: No cervical lymphadenopathy. Cardiovascular: Normal rate, regular rhythm.  No murmurs, rubs, or gallops. Respiratory: Normal respiratory effort without tachypnea nor retractions. Breath sounds are clear and equal bilaterally. No wheezes/rales/rhonchi. Gastrointestinal: Soft and nontender. No distention.  Genitourinary: Deferred Musculoskeletal: Normal range of motion in all extremities. Mild left knee effusion. No erythema. No warmth. No decrease ROM. N/V intact distally. Neurologic:  Normal speech and language. No gross focal neurologic deficits are appreciated.  Skin:  Skin is warm, dry and intact. No rash noted. Psychiatric: Mood and affect are normal. Speech and behavior are normal. Patient exhibits appropriate insight and judgment.  ____________________________________________    LABS (pertinent positives/negatives)  None  ____________________________________________   EKG  None  ____________________________________________    RADIOLOGY  [place] left lower extremity IMPRESSION: No evidence of deep venous thrombosis in the left lower extremity.  Left popliteal fossa Baker's cyst   ____________________________________________   PROCEDURES  Procedure(s) performed: None  Critical Care performed: No  ____________________________________________   INITIAL IMPRESSION / ASSESSMENT AND PLAN / ED COURSE  Pertinent labs & imaging results that were available during my care of the patient were reviewed by me and considered in my medical  decision making (see chart for details).  Patient presented to the emergency department today because of concerns for left knee pain and swelling. On exam she did have a small effusion to the left knee however no signs concerning for an infected joint at this time. Neurovascularly intact distally. Ultrasound showed a Baker's cyst. This point I discussed conservative treatment including rice. Will plan on giving orthopedic follow-up.  ____________________________________________   FINAL CLINICAL IMPRESSION(S) / ED DIAGNOSES  Final diagnoses:  Left knee pain  Baker's cyst, left  Knee effusion, left     Phineas Semen, MD 10/05/15 [phone]

## 2015-10-05 NOTE — ED Notes (Signed)
Pt c/o posterior L knee pain starting 2 days ago. Pt states L knee begain swelling on Friday.

## 2015-12-24 ENCOUNTER — Emergency Department: Payer: Self-pay

## 2015-12-24 ENCOUNTER — Encounter: Payer: Self-pay | Admitting: Emergency Medicine

## 2015-12-24 ENCOUNTER — Emergency Department
Admission: EM | Admit: 2015-12-24 | Discharge: 2015-12-24 | Disposition: A | Discharge status: Home / Self Care | Payer: Self-pay | Attending: Emergency Medicine | Admitting: Emergency Medicine

## 2015-12-24 DIAGNOSIS — R0989 Other specified symptoms and signs involving the circulatory and respiratory systems: Secondary | ICD-10-CM

## 2015-12-24 DIAGNOSIS — F458 Other somatoform disorders: Secondary | ICD-10-CM | POA: Insufficient documentation

## 2015-12-24 DIAGNOSIS — F17210 Nicotine dependence, cigarettes, uncomplicated: Secondary | ICD-10-CM | POA: Insufficient documentation

## 2015-12-24 LAB — BASIC METABOLIC PANEL
Anion gap: 9 (ref 5–15)
BUN: 9 mg/dL (ref 6–20)
CHLORIDE: 106 mmol/L (ref 101–111)
CO2: 21 mmol/L — ABNORMAL LOW (ref 22–32)
Calcium: 9.5 mg/dL (ref 8.9–10.3)
Creatinine, Ser: 0.75 mg/dL (ref 0.44–1.00)
GFR calc Af Amer: >60 mL/min (ref >60)
GFR calc non Af Amer: >60 mL/min (ref >60)
GLUCOSE: 90 mg/dL (ref 65–99)
POTASSIUM: 3.4 mmol/L — AB (ref 3.5–5.1)
Sodium: 136 mmol/L (ref 135–145)

## 2015-12-24 LAB — T4, FREE: Free T4: 1.26 ng/dL — ABNORMAL HIGH (ref 0.61–1.12)

## 2015-12-24 LAB — CBC
HEMATOCRIT: 34.9 % — AB (ref 35.0–47.0)
HEMOGLOBIN: 12.1 g/dL (ref 12.0–16.0)
MCH: 31.7 pg (ref 26.0–34.0)
MCHC: 34.8 g/dL (ref 32.0–36.0)
MCV: 91.2 fL (ref 80.0–100.0)
Platelets: 315 10*3/uL (ref 150–440)
RBC: 3.82 MIL/uL (ref 3.80–5.20)
RDW: 12.5 % (ref 11.5–14.5)
WBC: 10.2 10*3/uL (ref 3.6–11.0)

## 2015-12-24 LAB — TSH: TSH: 0.228 u[IU]/mL — AB (ref 0.350–4.500)

## 2015-12-24 LAB — TROPONIN I: Troponin I: <0.03 ng/mL (ref <0.031)

## 2015-12-24 MED ORDER — DIAZEPAM 5 MG/ML IJ SOLN
2.5000 mg | Freq: Once | INTRAMUSCULAR | Status: AC
  Administered 2015-12-24: 2.5 mg via INTRAVENOUS
  Filled 2015-12-24: qty 2

## 2015-12-24 NOTE — Discharge Instructions (Signed)
There is no indication on her workup today that you have anything stuck in your throat, either in the esophagus or trachea..  We encourage you to follow-up with your regular doctor or with the ear nose and throat specialist listed in this documentation, or with a gastroenterologist if you feel like you are having difficulty swallowing.  Return to the emergency department if you develop new or worsening symptoms that concern you.

## 2015-12-24 NOTE — ED Notes (Signed)
Patient presents to the ED with hyperventilating and complaining of shortness of breath and feeling like something is in her throat.  Patient reports history of anxiety.  Patient reports shortness of breath began after she was on the phone with a nurse who notified her that her thyroid level was low, suggestive of hyperthyroid.  Patient appears very anxious.

## 2015-12-24 NOTE — ED Provider Notes (Signed)
[facility] Emergency Department Provider Note  ____________________________________________  Time seen: Approximately 6:14 PM  I have reviewed the triage vital signs and the nursing notes.   HISTORY  Chief Complaint Shortness of Breath    HPI Madison Carroll is a 50 y.o. female with a history of bipolar disorder and anxiety who presents complaining of a sensation of shortness of breath and feeling like something is stuck in her throat or neck.  She says that she had a similar episode about 3 days ago but the symptoms only lasted about 20 minutes before completely resolving. Today she reports that the symptoms started acutely just after she finished a phone call with her primary care doctor's office who told her that she had an abnormal thyroid function tests and that she needs to come back tomorrow for more lab work because she might have hyperthyroidism.  She had just hung up the phone was on her way out the door when she felt the acute sensation of something being stuck in her throat and having difficulty breathing.  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  She describes the sensation as severe and nothing makes it better or worse.  She is more calm now than when she checked into triage; at that time she was hyperventilating but has since relaxed.  She denies fever/chills, chest pain, abdominal pain, vomiting (although she has had some nausea).   Past Medical History  Diagnosis Date  . Bipolar 1 disorder (HCC)   . Anxiety     There are no active problems to display for this patient.   History reviewed. No pertinent past surgical history.  Current Outpatient Rx  Name  Route  Sig  Dispense  Refill  . ibuprofen (ADVIL,MOTRIN) 600 MG tablet   Oral   Take 1 tablet (600 mg total) by mouth every 8 (eight) hours as  needed.   20 tablet   0     Allergies Codeine and Sulfa antibiotics  No family history on file.  Social History Social History  Substance Use Topics  . Smoking status: Current Every Day Smoker    Types: Cigarettes  . Smokeless tobacco: Never Used  . Alcohol Use: No    Review of Systems Constitutional: No fever/chills Eyes: No visual changes. ENT: No sore throat But has the sensation of a foreign body stuck in her throat Cardiovascular: Denies chest pain. Respiratory: Denies shortness of breath. Gastrointestinal: No abdominal pain.  nausea, no vomiting.  No diarrhea.  No constipation. Genitourinary: Negative for dysuria. Musculoskeletal: Negative for back pain. Skin: Negative for rash. Neurological: Negative for headaches, focal weakness or numbness.  10-point ROS otherwise negative.  ____________________________________________   PHYSICAL EXAM:  VITAL SIGNS: ED Triage Vitals  Enc Vitals Group     BP 12/24/15 1708 144/101 mmHg     Pulse Rate 12/24/15 1708 73     Resp 12/24/15 1708 30     Temp 12/24/15 1708 98.8 F (37.1 C)     Temp Source 12/24/15 1708 Oral     SpO2 12/24/15 1708 100 %     Weight 12/24/15 1708 180 lb 4.8 oz (81.784 kg)     Height 12/24/15 1708  (1.6 m)     Head Cir --      Peak Flow --      Pain  Score 12/24/15 1710 0     Pain Loc --      Pain Edu? --      Excl. in GC? --     Constitutional: Alert and oriented. Generally well appearing but anxiety Eyes: Conjunctivae are normal. PERRL. EOMI. Head: Atraumatic. Nose: No congestion/rhinnorhea. Mouth/Throat: Mucous membranes are moist.  Oropharynx non-erythematous.  Multiple chronic dental caries but no acute abscesses, soft under time, no indication of Ludwig's, PTA, RPA, etc. Neck: No stridor.  No meningeal signs.  No palpable nodules.  No brawny induration. Cardiovascular: Normal rate, regular rhythm. Good peripheral circulation. Grossly normal heart sounds.   Respiratory: Normal  respiratory effort.  Occasionally retracts strongly as if against an obstruction Gastrointestinal: Soft and nontender. No distention.  Musculoskeletal: No lower extremity tenderness nor edema. No gross deformities of extremities. Neurologic:  Normal speech and language. No gross focal neurologic deficits are appreciated.  Skin:  Skin is warm, dry and intact. No rash noted. Psychiatric: Mood and affect are anxious  ____________________________________________   LABS (all labs ordered are listed, but only abnormal results are displayed)  Labs Reviewed  BASIC METABOLIC PANEL - Abnormal; Notable for the following:    Potassium 3.4 (*)    CO2 21 (*)    All other components within normal limits  CBC - Abnormal; Notable for the following:    HCT 34.9 (*)    All other components within normal limits  T4, FREE - Abnormal; Notable for the following:    Free T4 1.26 (*)    All other components within normal limits  TSH - Abnormal; Notable for the following:    TSH 0.228 (*)    All other components within normal limits  TROPONIN I   ____________________________________________  EKG  ED ECG REPORT I, Markeis Allman, the attending physician, personally viewed and interpreted this ECG.  Date: 12/24/2015 EKG Time: 17:11 Rate: 65 Rhythm: normal sinus rhythm QRS Axis: normal Intervals: normal ST/T Wave abnormalities: normal Conduction Disturbances: none Narrative Interpretation: unremarkable  ____________________________________________  RADIOLOGY   Dg Neck Soft Tissue  12/24/2015  CLINICAL DATA:  Healing of soft tissue fullness in throat EXAM: NECK SOFT TISSUES - 1+ VIEW COMPARISON:  None. FINDINGS: Frontal and lateral views were obtained. The epiglottis and aryepiglottic folds appear normal. No radiopaque foreign body is evident prevertebral soft tissues are normal. No air-fluid levels to suggest abscess. Tongue base region appears normal. There is mild degenerative change in the  cervical spine. IMPRESSION: No radiopaque foreign body evident. Epiglottis and aryepiglottic folds appear normal. No evidence suggesting abscess. Electronically Signed   By: Bretta Bang III M.D.   On: 12/24/2015 18:49   Dg Chest 2 View  12/24/2015  CLINICAL DATA:  Acute onset of shortness of breath. Hyperventilation. Sensation of object in throat. Initial encounter. EXAM: CHEST  2 VIEW COMPARISON:  Chest radiograph performed 06/23/2013 FINDINGS: The lungs are well-aerated. Minimal bibasilar atelectasis is noted. Mild vascular congestion is seen. There is no evidence of pleural effusion or pneumothorax. The heart is borderline normal in size. No acute osseous abnormalities are seen. Clips are noted within the right upper quadrant, reflecting prior cholecystectomy. IMPRESSION: Minimal bibasilar atelectasis noted.  Mild vascular congestion seen. Electronically Signed   By: Roanna Raider M.D.   On: 12/24/2015 18:45    ____________________________________________   PROCEDURES  Procedure(s) performed: None  Critical Care performed: No ____________________________________________   INITIAL IMPRESSION / ASSESSMENT AND PLAN / ED COURSE  Pertinent labs & imaging results that were available  during my care of the patient were reviewed by me and considered in my medical decision making (see chart for details).  The patient was very clear that she was not eating or drinking anything and had nothing in her mouth at the time of the onset of her symptoms, as she had just gotten some potentially bad news from her PCP.  I suspect this is mostly anxiety related.  She has no physical exam findings to suggest that she has any sort of obstruction either in the esophagus or trachea.  I will evaluate with plain films (chest, soft tissue neck) and give a dose of Valium 2.5 mg IV to help with both anxiety and muscle relaxing, but I suspect this will be an outpatient follow-up  issue.  ----------------------------------------- 7:51 PM on 12/24/2015 -----------------------------------------  The patient's lab work is unremarkable and her soft tissue neck and chest x-rays are unremarkable.  She states she feels about the same after the Valium but she looks more comfortable and is no longer having the occasional retraction to overcome the globus sensation.  She has been tolerating by mouth intake in the emergency department.  I encouraged her to follow up with her regular doctor and I also provided the name and number of ENT for follow-up if she continues to have the sensation in her throat.  I gave my usual and customary return precautions.    I let the patient know that her free T4 is only slightly elevated and encouraged her to let her doctor know as well.   ____________________________________________  FINAL CLINICAL IMPRESSION(S) / ED DIAGNOSES  Final diagnoses:  Globus pharyngeus      NEW MEDICATIONS STARTED DURING THIS VISIT:  New Prescriptions   No medications on file      Note:  This document was prepared using Dragon voice recognition software and may include unintentional dictation errors.   Loleta Roseory Tonita Bills, MD 12/24/15 2018

## 2016-03-14 IMAGING — CR DG LUMBAR SPINE 2-3V
1 series · 3 of 3 positions shown · non-contrast
Comparison: CT of the abdomen and pelvis June 15, 2011.

CLINICAL DATA: Fall from bed.

EXAM:
LUMBAR SPINE - 2-3 VIEW

[Series 1: t lumbar spine ap · 0.14mm/px · 3 of 3 slices shown]
[im 1/3]
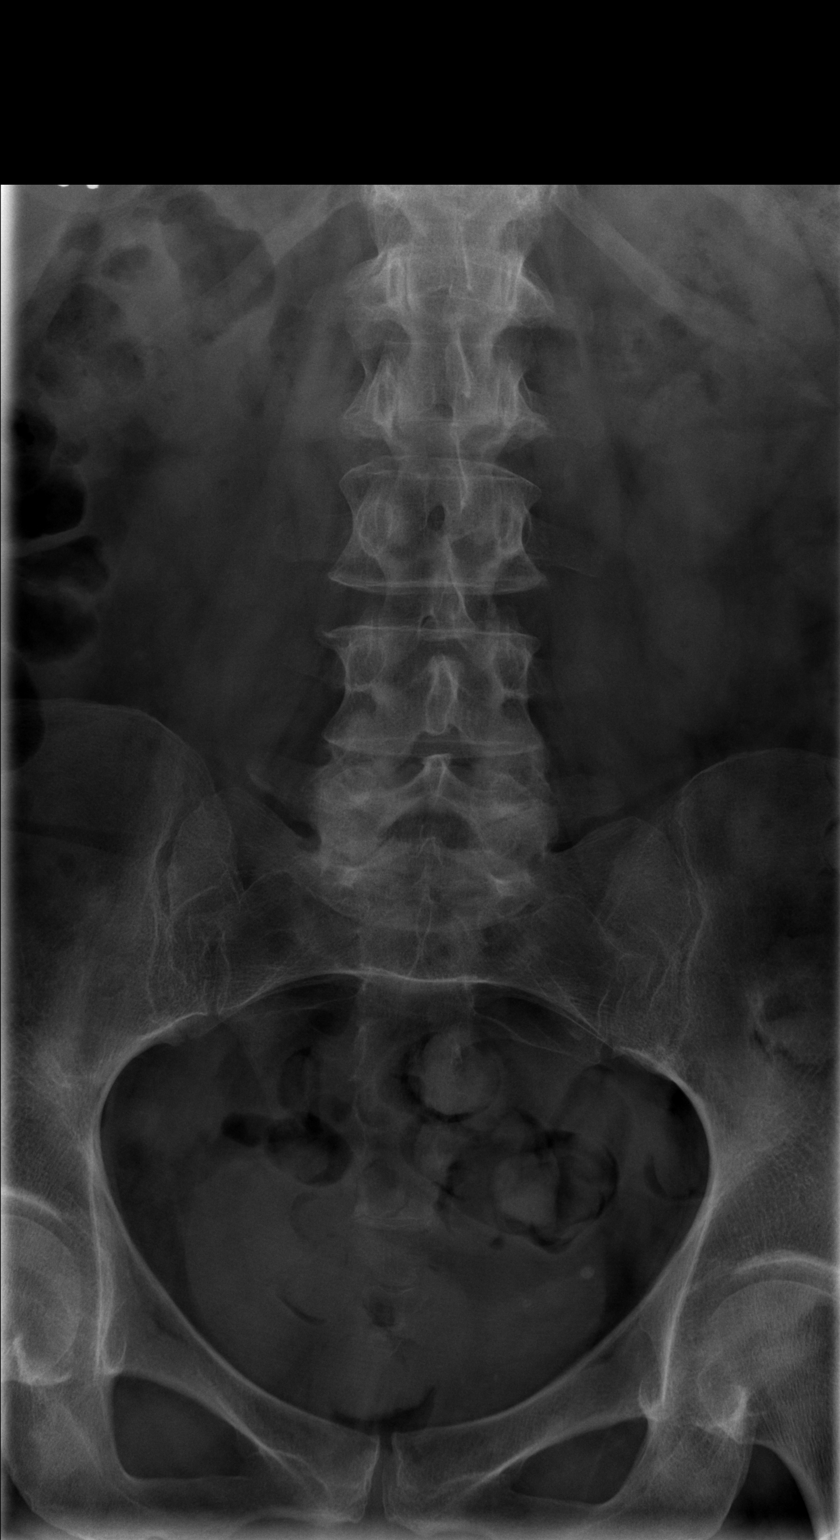
[im 2/3]
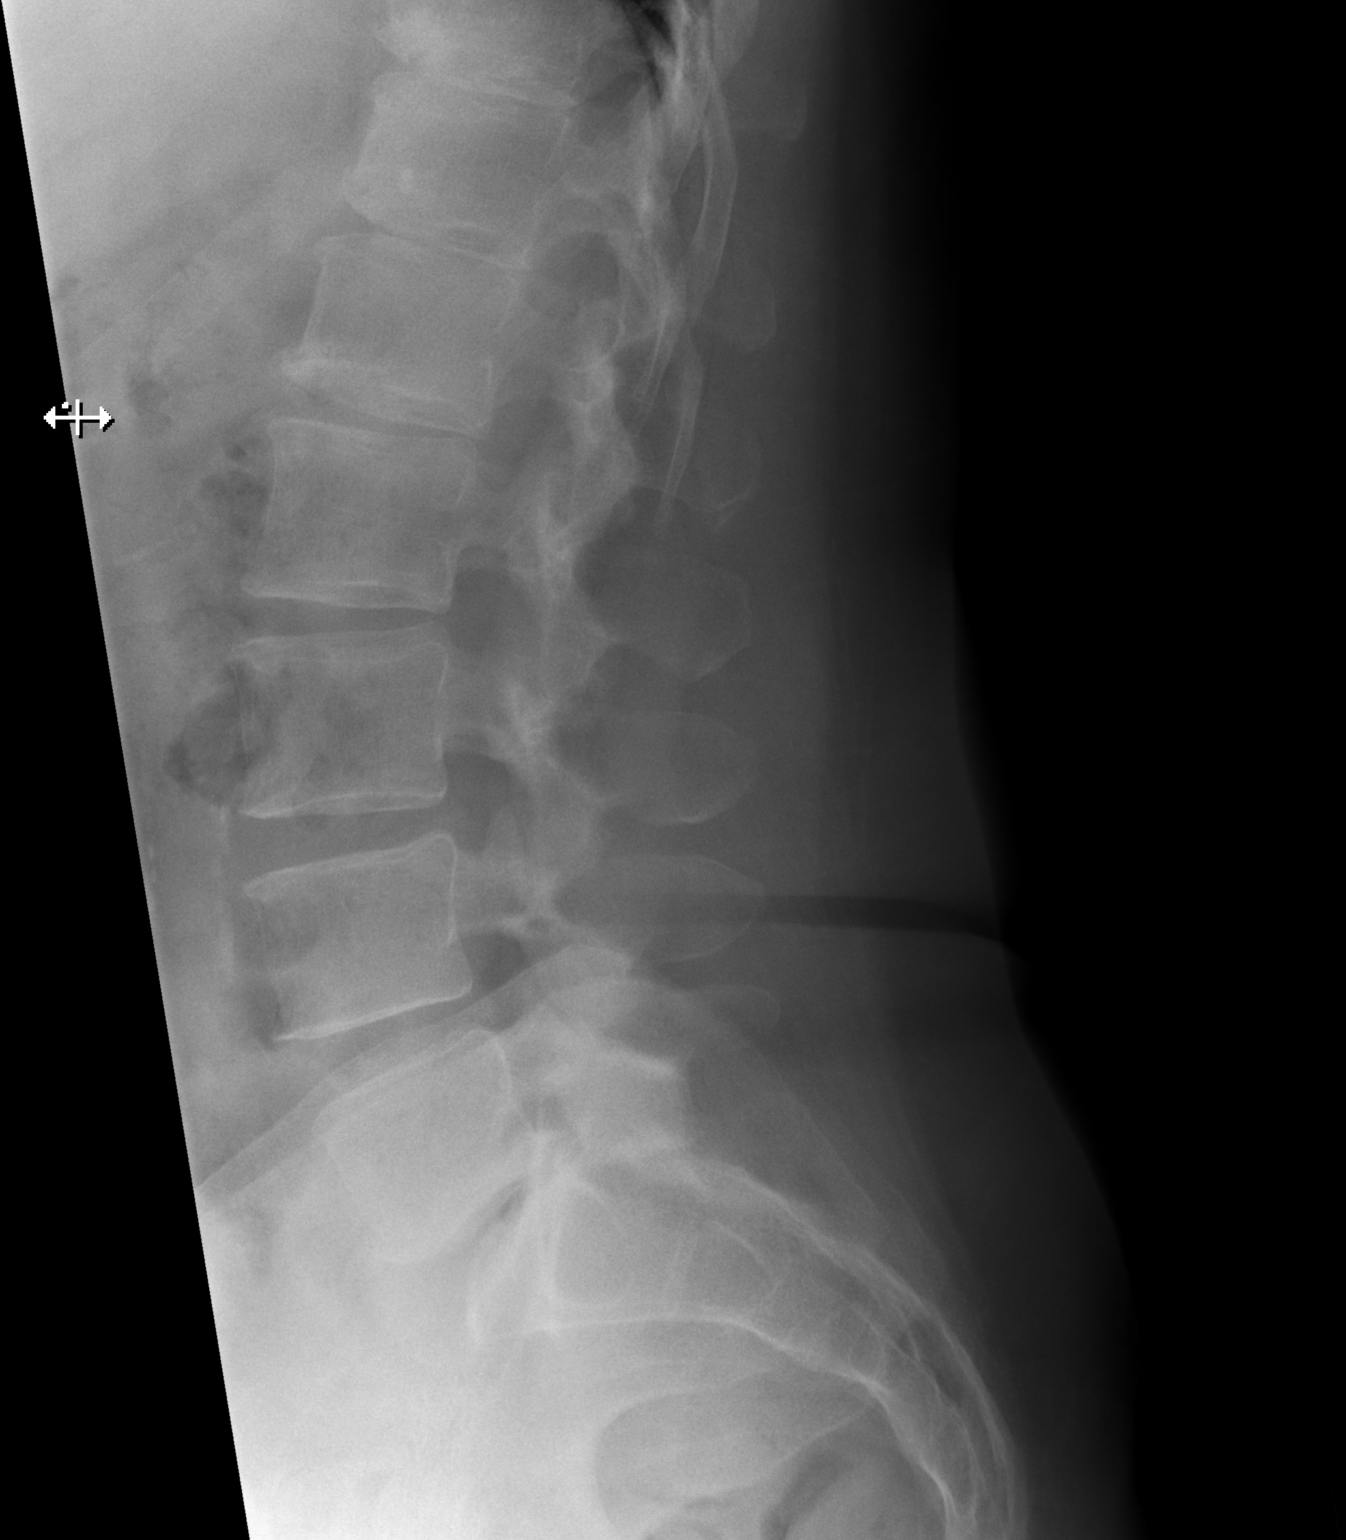
[im 3/3]
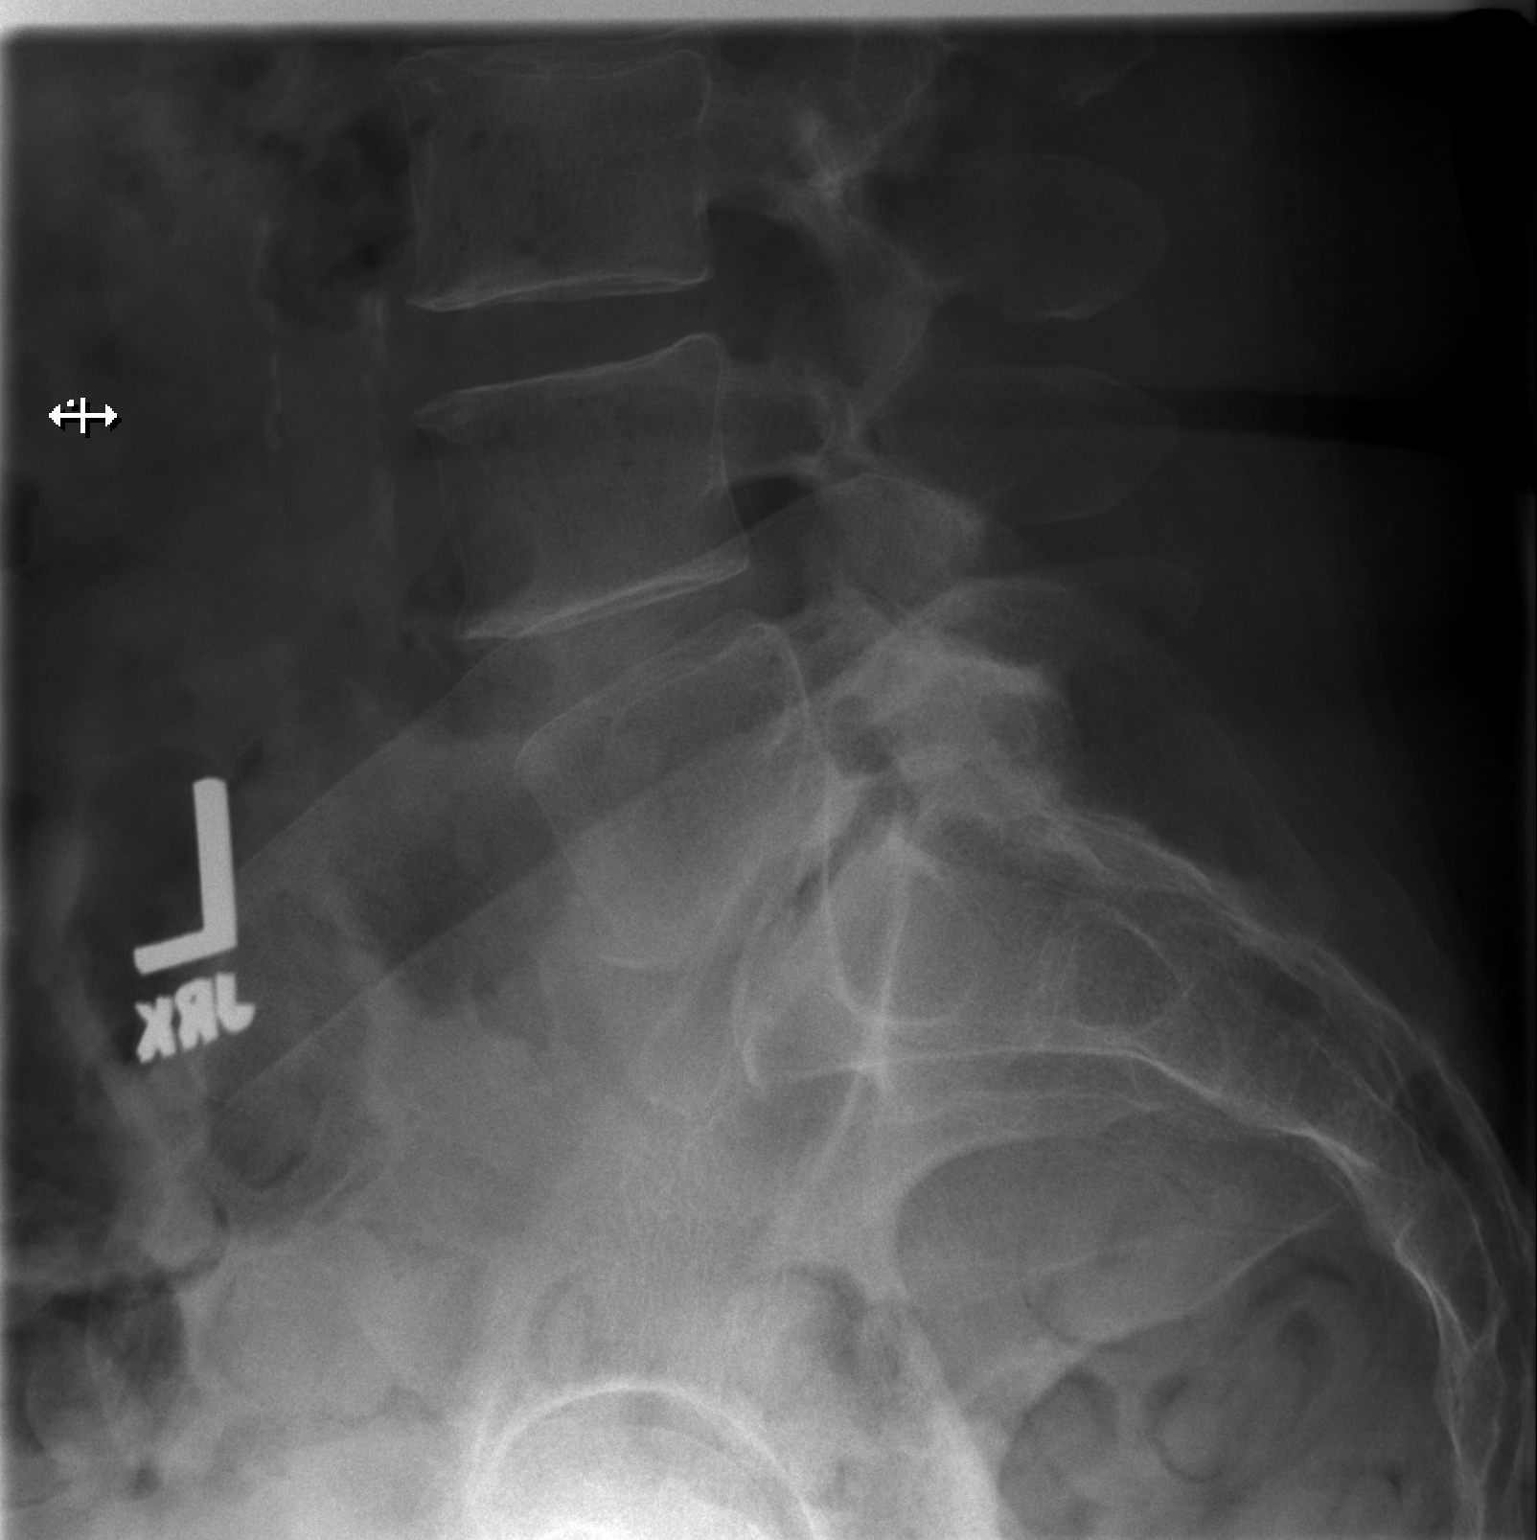

[3 of 3 positions shown; findings below may reference images not displayed]

FINDINGS: There is no evidence of lumbar spine fracture. Alignment is normal.
Moderate to severe L5-S1 disc degeneration. Small calculus projects
in the upper pole right kidney. Surgical clips in the included right
abdomen likely reflect cholecystectomy. Phleboliths in left pelvis.
IMPRESSION: No acute lumbar spine fracture deformity or malalignment.

Moderate to severe L5-S1 disc degeneration.

Calculus projects in upper pole right kidney, unchanged.

  By: Chancha Zubia

## 2016-08-23 ENCOUNTER — Emergency Department
Admission: EM | Admit: 2016-08-23 | Discharge: 2016-08-23 | Disposition: A | Discharge status: Home / Self Care | Payer: Self-pay | Attending: Emergency Medicine | Admitting: Emergency Medicine

## 2016-08-23 ENCOUNTER — Emergency Department: Payer: Self-pay

## 2016-08-23 ENCOUNTER — Encounter: Payer: Self-pay | Admitting: Emergency Medicine

## 2016-08-23 DIAGNOSIS — F17210 Nicotine dependence, cigarettes, uncomplicated: Secondary | ICD-10-CM | POA: Insufficient documentation

## 2016-08-23 DIAGNOSIS — Y929 Unspecified place or not applicable: Secondary | ICD-10-CM | POA: Insufficient documentation

## 2016-08-23 DIAGNOSIS — Y9301 Activity, walking, marching and hiking: Secondary | ICD-10-CM | POA: Insufficient documentation

## 2016-08-23 DIAGNOSIS — M940 Chondrocostal junction syndrome [Tietze]: Secondary | ICD-10-CM | POA: Insufficient documentation

## 2016-08-23 DIAGNOSIS — W228XXA Striking against or struck by other objects, initial encounter: Secondary | ICD-10-CM | POA: Insufficient documentation

## 2016-08-23 DIAGNOSIS — Y998 Other external cause status: Secondary | ICD-10-CM | POA: Insufficient documentation

## 2016-08-23 MED ORDER — MELOXICAM 15 MG PO TABS
15.0000 mg | ORAL_TABLET | Freq: Every day | ORAL | 0 refills | Status: AC
Start: 2016-08-23 — End: 2016-09-02

## 2016-08-23 MED ORDER — MELOXICAM 7.5 MG PO TABS
15.0000 mg | ORAL_TABLET | Freq: Once | ORAL | Status: AC
  Administered 2016-08-23: 15 mg via ORAL
  Filled 2016-08-23: qty 2

## 2016-08-23 NOTE — ED Triage Notes (Addendum)
C/O left scapular pain and sob since Friday.  Pain started after sister was "walking on back to crack it and she slipped"  Pain worse with coughing and movement.

## 2016-08-23 NOTE — ED Notes (Signed)
Pt verbalized understanding of discharge instructions. NAD at this time. 

## 2016-08-23 NOTE — ED Provider Notes (Signed)
[facility] Emergency Department Provider Note  ____________________________________________  Time seen: Approximately 3:56 PM  I have reviewed the triage vital signs and the nursing notes.   HISTORY  Chief Complaint Shortness of Breath    HPI Madison Carroll is a 50 y.o. female presents with right rib pain since Friday after sister was trying to crack back and slipped. Patient states that it hurts on right side over her lower rib cage with every breath. Patient denies shortness of breath or chest pain. Patient has difficulty moving right arm due to pain over lower right rib cage. Patient denies left scapular pain. Patient has not taken anything for pain.   Past Medical History:  Diagnosis Date  . Anxiety   . Bipolar 1 disorder (HCC)     There are no active problems to display for this patient.   History reviewed. No pertinent surgical history.  Prior to Admission medications   Medication Sig Start Date End Date Taking? Authorizing Provider  ibuprofen (ADVIL,MOTRIN) 600 MG tablet Take 1 tablet (600 mg total) by mouth every 8 (eight) hours as needed. 10/05/15   Phineas SemenGraydon Goodman, MD  meloxicam (MOBIC) 15 MG tablet Take 1 tablet (15 mg total) by mouth daily. 08/23/16 09/02/16  Enid DerryAshley Ane Conerly, PA-C    Allergies Codeine; Sulfa antibiotics; and Zofran [ondansetron hcl]  No family history on file.  Social History Social History  Substance Use Topics  . Smoking status: Current Every Day Smoker    Types: Cigarettes  . Smokeless tobacco: Never Used  . Alcohol use No     Review of Systems  Constitutional: No fever/chills Eyes: No visual changes. No discharge Cardiovascular: no chest pain. Respiratory: no cough. No SOB. Gastrointestinal: No abdominal pain.  No nausea, no vomiting.  No diarrhea.  No constipation. Skin: Negative for rash, abrasions, lacerations, ecchymosis. Neurological: Negative for  headaches.  ____________________________________________   PHYSICAL EXAM:  VITAL SIGNS: ED Triage Vitals  Enc Vitals Group     BP 08/23/16 1348 134/69     Pulse Rate 08/23/16 1348 76     Resp 08/23/16 1348 18     Temp 08/23/16 1348 97.5 F (36.4 C)     Temp Source 08/23/16 1348 Oral     SpO2 08/23/16 1348 100 %     Weight 08/23/16 1345 155 lb (70.3 kg)     Height 08/23/16 1345 5\' 3"  (1.6 m)     Head Circumference --      Peak Flow --      Pain Score 08/23/16 1345 10     Pain Loc --      Pain Edu? --      Excl. in [place]? --      Constitutional: Alert and oriented. Well appearing and in no acute distress. Eyes: Conjunctivae are normal.  Head: Atraumatic. ENT:      Ears:       Nose: No congestion/rhinnorhea.      Mouth/Throat: Mucous membranes are moist.  Neck: No stridor.  No cervical spine tenderness to palpation. Cardiovascular: Normal rate, regular rhythm. Normal S1 and S2.  Good peripheral circulation. Respiratory: Normal respiratory effort without tachypnea or retractions. Lungs CTAB. Good air entry to the bases with no decreased or absent breath sounds. Gastrointestinal: Bowel sounds 4 quadrants. Soft and nontender to palpation.  Musculoskeletal: Tenderness to palpation over lower right rib cage. Limited range of motion of right shoulder due to pain on lower right rib cage. No tenderness to palpation over right shoulder or  right or left scapula. No tenderness to palpation over thoracic or lumbar spine. Neurologic:  Normal speech and language. No gross focal neurologic deficits are appreciated.  Skin:  Skin is warm, dry and intact. No rash noted. Psychiatric: Mood and affect are normal. Speech and behavior are normal. Patient exhibits appropriate insight and judgement.   ____________________________________________   LABS (all labs ordered are listed, but only abnormal results are displayed)  Labs Reviewed - No data to  display ____________________________________________  EKG   ____________________________________________  RADIOLOGY Lexine BatonI, Ramie Palladino, personally viewed and evaluated these images (plain radiographs) as part of my medical decision making, as well as reviewing the written report by the radiologist.  Dg Ribs Unilateral W/chest Right  Result Date: 08/23/2016 CLINICAL DATA:  Fall 2 days prior with generalized right chest wall pain. EXAM: RIGHT RIBS AND CHEST - 3+ VIEW COMPARISON:  12/24/2015 chest radiograph. FINDINGS: Stable cardiomediastinal silhouette with normal heart size. No pneumothorax. No pleural effusion. Lungs appear clear, with no acute consolidative airspace disease and no pulmonary edema. Surgical clips are seen in the right upper quadrant of the abdomen. No fracture or suspicious focal osseous lesion is seen in the right ribs. IMPRESSION: 1. No active cardiopulmonary disease. 2. No right rib fracture detected. Should the patient's symptoms persist or worsen, repeat radiographs of the ribs in 10 - 14 days maybe of use to detect subtle nondisplaced rib fractures (which are commonly occult on initial imaging). Electronically Signed   By: Delbert PhenixJason A Poff M.D.   On: 08/23/2016 15:14    ____________________________________________    PROCEDURES  Procedure(s) performed:    Procedures    Medications  meloxicam (MOBIC) tablet 15 mg (15 mg Oral Given 08/23/16 1542)     ____________________________________________   INITIAL IMPRESSION / ASSESSMENT AND PLAN / ED COURSE  Pertinent labs & imaging results that were available during my care of the patient were reviewed by me and considered in my medical decision making (see chart for details).  Review of the [place] CSRS was performed in accordance of the [facility] prior to dispensing any controlled drugs.  Clinical Course     Patient's diagnosis is consistent with Costochondritis. Patient has pain over rib cage with every breath but is  not having any shortness of breath. X-ray did not show any acute bony abnormalities or any active cardiopulmonary processes. Patient will be discharged home with prescriptions for meloxicam. Patient is to follow up with PCP for symptoms. Patient is given ED precautions to return to the ED for any worsening or new symptoms.  ____________________________________________  FINAL CLINICAL IMPRESSION(S) / ED DIAGNOSES  Final diagnoses:  Costochondritis      NEW MEDICATIONS STARTED DURING THIS VISIT:  Discharge Medication List as of 08/23/2016  3:36 PM    START taking these medications   Details  meloxicam (MOBIC) 15 MG tablet Take 1 tablet (15 mg total) by mouth daily., Starting Sun 08/23/2016, Until Wed 09/02/2016, Print            This chart was dictated using voice recognition software/Dragon. Despite best efforts to proofread, errors can occur which can change the meaning. Any change was purely unintentional.   Enid DerryAshley Vahan Wadsworth, PA-C 08/23/16 1606    Sharyn CreamerMark Quale, MD 08/30/16 [phone]

## 2017-05-12 ENCOUNTER — Emergency Department
Admission: EM | Admit: 2017-05-12 | Discharge: 2017-05-12 | Disposition: A | Discharge status: Home / Self Care | Payer: Self-pay | Attending: Emergency Medicine | Admitting: Emergency Medicine

## 2017-05-12 ENCOUNTER — Encounter: Payer: Self-pay | Admitting: Emergency Medicine

## 2017-05-12 ENCOUNTER — Emergency Department: Payer: Self-pay

## 2017-05-12 DIAGNOSIS — Z79899 Other long term (current) drug therapy: Secondary | ICD-10-CM | POA: Insufficient documentation

## 2017-05-12 DIAGNOSIS — G43801 Other migraine, not intractable, with status migrainosus: Secondary | ICD-10-CM | POA: Insufficient documentation

## 2017-05-12 DIAGNOSIS — F17210 Nicotine dependence, cigarettes, uncomplicated: Secondary | ICD-10-CM | POA: Insufficient documentation

## 2017-05-12 DIAGNOSIS — R6884 Jaw pain: Secondary | ICD-10-CM

## 2017-05-12 DIAGNOSIS — R079 Chest pain, unspecified: Secondary | ICD-10-CM | POA: Insufficient documentation

## 2017-05-12 DIAGNOSIS — M26622 Arthralgia of left temporomandibular joint: Secondary | ICD-10-CM | POA: Insufficient documentation

## 2017-05-12 LAB — CBC
HCT: 36.6 % (ref 35.0–47.0)
Hemoglobin: 13.1 g/dL (ref 12.0–16.0)
MCH: 33 pg (ref 26.0–34.0)
MCHC: 35.8 g/dL (ref 32.0–36.0)
MCV: 92 fL (ref 80.0–100.0)
PLATELETS: 316 10*3/uL (ref 150–440)
RBC: 3.97 MIL/uL (ref 3.80–5.20)
RDW: 12.6 % (ref 11.5–14.5)
WBC: 9.2 10*3/uL (ref 3.6–11.0)

## 2017-05-12 LAB — BASIC METABOLIC PANEL
Anion gap: 7 (ref 5–15)
BUN: 12 mg/dL (ref 6–20)
CHLORIDE: 106 mmol/L (ref 101–111)
CO2: 27 mmol/L (ref 22–32)
CREATININE: 0.84 mg/dL (ref 0.44–1.00)
Calcium: 9.2 mg/dL (ref 8.9–10.3)
GFR calc Af Amer: >60 mL/min (ref >60)
GFR calc non Af Amer: >60 mL/min (ref >60)
Glucose, Bld: 123 mg/dL — ABNORMAL HIGH (ref 65–99)
Potassium: 4.1 mmol/L (ref 3.5–5.1)
Sodium: 140 mmol/L (ref 135–145)

## 2017-05-12 LAB — TROPONIN I: Troponin I: <0.03 ng/mL (ref <0.03)

## 2017-05-12 LAB — SEDIMENTATION RATE: Sed Rate: 16 mm/hr (ref 0–30)

## 2017-05-12 LAB — C-REACTIVE PROTEIN: CRP: <0.8 mg/dL (ref <1.0)

## 2017-05-12 LAB — POCT PREGNANCY, URINE: Preg Test, Ur: NEGATIVE

## 2017-05-12 MED ORDER — PREDNISONE 20 MG PO TABS: 60.0000 mg | ORAL_TABLET | Freq: Every day | ORAL | 0 refills | Status: AC

## 2017-05-12 MED ORDER — IBUPROFEN 600 MG PO TABS: 600.0000 mg | ORAL_TABLET | Freq: Four times a day (QID) | ORAL | 0 refills | Status: DC | PRN

## 2017-05-12 MED ORDER — DEXAMETHASONE SODIUM PHOSPHATE 4 MG/ML IJ SOLN
10.0000 mg | Freq: Once | INTRAMUSCULAR | Status: AC
  Administered 2017-05-12: 10 mg via INTRAVENOUS
  Filled 2017-05-12: qty 3
  Filled 2017-05-12: qty 2.5

## 2017-05-12 MED ORDER — FAMOTIDINE 20 MG PO TABS: 20.0000 mg | ORAL_TABLET | Freq: Every day | ORAL | 0 refills | Status: DC | PRN

## 2017-05-12 MED ORDER — IOPAMIDOL (ISOVUE-370) INJECTION 76%
75.0000 mL | Freq: Once | INTRAVENOUS | Status: AC | PRN
  Administered 2017-05-12: 75 mL via INTRAVENOUS
  Filled 2017-05-12: qty 75

## 2017-05-12 MED ORDER — SODIUM CHLORIDE 0.9 % IV BOLUS (SEPSIS)
1000.0000 mL | Freq: Once | INTRAVENOUS | Status: AC
  Administered 2017-05-12: 1000 mL via INTRAVENOUS

## 2017-05-12 MED ORDER — METOCLOPRAMIDE HCL 5 MG/ML IJ SOLN
10.0000 mg | Freq: Once | INTRAMUSCULAR | Status: AC
  Administered 2017-05-12: 10 mg via INTRAVENOUS
  Filled 2017-05-12: qty 2

## 2017-05-12 MED ORDER — KETOROLAC TROMETHAMINE 30 MG/ML IJ SOLN
15.0000 mg | Freq: Once | INTRAMUSCULAR | Status: AC
  Administered 2017-05-12: 15 mg via INTRAVENOUS
  Filled 2017-05-12: qty 1

## 2017-05-12 MED ORDER — DIPHENHYDRAMINE HCL 50 MG/ML IJ SOLN
25.0000 mg | Freq: Once | INTRAMUSCULAR | Status: AC
  Administered 2017-05-12: 25 mg via INTRAVENOUS
  Filled 2017-05-12: qty 1

## 2017-05-12 NOTE — ED Provider Notes (Signed)
[facility] Emergency Department Provider Note  ____________________________________________  Time seen: Approximately 3:30 PM  I have reviewed the triage vital signs and the nursing notes.   HISTORY  Chief Complaint Migraine and Chest Pain   HPI Madison Carroll is a 51 y.o. female with a history of migraine headaches, anxiety, bipolar disorder who presents for evaluation of a headache.Patient reports that she woke up 6 days ago with a very bad migraine located in the left side of her head, associated with photophobia and nausea. These symptoms are identical to her chronic migraine headaches.however over the course of the last 2-3 days she endorses pain in her left jaw and left neck which are not usually present with her migraines. She describes the sensation that her jaw is locking and popping, no jaw claudication. She also reports that her neck pain feels like sore muscle pain "like if I slept on it in a funny position" but she denies limited ROM. Yesterday she noticed blurry vision while driving and has had 2 other episodes of blurry vision since yesterday. She does endorse the blurry vision occasionally happens with her migraine headaches. No personal or family history of aneurysm or stroke. No fever or chills, no chest pain or shortness of breath. Triage note states the patient was complaining of chest pain the patient tells me that she is just feeling anxious about all of this and not truly having chest pain. She is a smoker.  Past Medical History:  Diagnosis Date  . Anxiety   . Bipolar 1 disorder ([place])     There are no active problems to display for this patient.   History reviewed. No pertinent surgical history.  Prior to Admission medications   Medication Sig Start Date End Date Taking? Authorizing Provider  famotidine (PEPCID) 20 MG tablet Take 1 tablet (20 mg total) by mouth daily as needed (together with steroids). 05/12/17 05/17/17  Rudene Re, MD  ibuprofen (ADVIL,MOTRIN) 600 MG tablet Take 1 tablet (600 mg total) by mouth every 6 (six) hours as needed. 05/12/17   Rudene Re, MD  predniSONE (DELTASONE) 20 MG tablet Take 3 tablets (60 mg total) by mouth daily. 05/12/17 05/16/17  Rudene Re, MD    Allergies Codeine; Sulfa antibiotics; and Zofran [ondansetron hcl]  History reviewed. No pertinent family history.  Social History Social History  Substance Use Topics  . Smoking status: Current Every Day Smoker    Types: Cigarettes  . Smokeless tobacco: Never Used  . Alcohol use No    Review of Systems  Constitutional: Negative for fever. Eyes: Negative for visual changes. ENT: Negative for sore throat. + L jaw pain Neck: + L neck pain  Cardiovascular: Negative for chest pain. Respiratory: Negative for shortness of breath. Gastrointestinal: Negative for abdominal pain, vomiting or diarrhea. Genitourinary: Negative for dysuria. Musculoskeletal: Negative for back pain. Skin: Negative for rash. Neurological: Negative for weakness or numbness. + HA Psych: No SI or HI  ____________________________________________   PHYSICAL EXAM:  VITAL SIGNS: ED Triage Vitals  Enc Vitals Group     BP 05/12/17 1344 (!) 168/103     Pulse Rate 05/12/17 1344 83     Resp 05/12/17 1344 18     Temp 05/12/17 1344 98.6 F (37 C)     Temp Source 05/12/17 1344 Oral     SpO2 05/12/17 1344 100 %     Weight 05/12/17 1346 160 lb (72.6 kg)     Height 05/12/17 1346 _0  (1.6 m)  Head Circumference --      Peak Flow --      Pain Score 05/12/17 1343 10     Pain Loc --      Pain Edu? --      Excl. in [place]? --     Constitutional: Alert and oriented. Well appearing and in no apparent distress, covering the light with sheets in the room. HEENT:      Head: Normocephalic and atraumatic.         Eyes: Conjunctivae are normal. Sclera is non-icteric.       Mouth/Throat: Mucous membranes are moist. There is some popping of the  left TMJ with ttp over the entire L face including L temporal artery      Neck: Supple with no signs of meningismus. no tenderness to palpation. No bruit Cardiovascular: Regular rate and rhythm. No murmurs, gallops, or rubs. 2+ symmetrical distal pulses are present in all extremities. No JVD. Respiratory: Normal respiratory effort. Lungs are clear to auscultation bilaterally. No wheezes, crackles, or rhonchi.  Gastrointestinal: Soft, non tender, and non distended with positive bowel sounds. No rebound or guarding. Musculoskeletal: Nontender with normal range of motion in all extremities. No edema, cyanosis, or erythema of extremities. Neurologic: Normal speech and language. A & O x3, PERRL, no nystagmus, CN II-XII intact, motor testing reveals good tone and bulk throughout. There is no evidence of pronator drift or dysmetria. Muscle strength is 5/5 throughout. Deep tendon reflexes are 2+ throughout with downgoing toes. Sensory examination is intact. Gait is normal. Skin: Skin is warm, dry and intact. No rash noted. Psychiatric: Mood and affect are normal. Speech and behavior are normal.  ____________________________________________   LABS (all labs ordered are listed, but only abnormal results are displayed)  Labs Reviewed  BASIC METABOLIC PANEL - Abnormal; Notable for the following:       Result Value   Glucose, Bld 123 (*)    All other components within normal limits  CBC  TROPONIN I  SEDIMENTATION RATE  C-REACTIVE PROTEIN  POCT PREGNANCY, URINE   ____________________________________________  EKG  ED ECG REPORT I, Rudene Re, the attending physician, personally viewed and interpreted this ECG.  Normal sinus rhythm, rate of 79, normal intervals, normal axis, no ST elevations or depressions. ____________________________________________  RADIOLOGY  CTA head and neck: No acute finding or specific explanation for headache.  Mild cervical and intracranial atherosclerosis  without flow limiting stenosis.  Mild cerebral white matter disease. The pattern is nonspecific, but often attributed to chronic microvascular insults. Patient's history of migraines could contribute. No specific demyelinating pattern.    XR mandible: Negative ____________________________________________   PROCEDURES  Procedure(s) performed: None Procedures Critical Care performed:  None ____________________________________________   INITIAL IMPRESSION / ASSESSMENT AND PLAN / ED COURSE  51 y.o. female with a history of migraine headaches, anxiety, bipolar disorder who presents for evaluation of a headache x6 days, blurry vision, L sided jaw pain and L neck pain. Patient is neuro intact. Ddx migraine vs TMJ vs SAH vs cervical dissection vs GCA with jaw pain and blurry vision. Patient has ttp over the L temporal artery but also over the entire L face, opening and closing of the jaw leads to popping at the L TMJ with slight trismus. Will get CTA head and neck, inflammatory markers such as ESR and CRP. Will treat pain with sterois and migraine cocktail.    _________________________ 6:32 PM on 05/12/2017 -----------------------------------------  Patient remains well appearing and neuro intact. CTA with no  evidence of aneurysm or dissection. ESR WNL. At this time, clinically, I do believe patient's presentation is due to a migraine and TMJ arthralgia. X-rays of the jaw were within normal limits. The fact the patient has slight trismus and popping of the jaw makes this the most likely diagnosis. I discussed with her follow up with ear nose and throat and I recommend return to the emergency room if she has jaw claudication, changes in vision, or worsening pain. I am going to discharge her home on prednisone for inflammation and NSAIDs. Also recommended return to the ER for thunderclap HA or signs of stroke.  Pertinent labs & imaging results that were available during my care of the patient  were reviewed by me and considered in my medical decision making (see chart for details).    ____________________________________________   FINAL CLINICAL IMPRESSION(S) / ED DIAGNOSES  Final diagnoses:  Arthralgia of left temporomandibular joint  Other migraine with status migrainosus, not intractable      NEW MEDICATIONS STARTED DURING THIS VISIT:  New Prescriptions   FAMOTIDINE (PEPCID) 20 MG TABLET    Take 1 tablet (20 mg total) by mouth daily as needed (together with steroids).   IBUPROFEN (ADVIL,MOTRIN) 600 MG TABLET    Take 1 tablet (600 mg total) by mouth every 6 (six) hours as needed.   PREDNISONE (DELTASONE) 20 MG TABLET    Take 3 tablets (60 mg total) by mouth daily.     Note:  This document was prepared using Dragon voice recognition software and may include unintentional dictation errors.    Alfred Levins, [place], MD 05/12/17 [phone]

## 2017-05-12 NOTE — ED Notes (Signed)
NAD noted at time of D/C. Pt denies questions or concerns. Pt ambulatory to the lobby at this time.  

## 2017-05-12 NOTE — Discharge Instructions (Signed)
Follow up with [facility] for your jaw pain. Take prednisone and ibuprofen as prescribed. Take pepcid with the steroids to prevent stomach inflammation or ulcers. Return to the ER for changes in vision, cramping pain on your face while eating, fever, severe headache, slurred speech, facial droop, weakness or numbness of your extremities.

## 2017-05-12 NOTE — ED Triage Notes (Signed)
Pt presents to ED with c/o mirgrain x 6 days and L sided chest pain that started yesterday. Pt states yesterday began having L sided chest pain, denies radiation at this time, c/o dizziness with the chest pain, denies any cardiac hx. Pt also states 6 days ago began having a L sided migraine that has progressed down her neck. Pt states while she was driving yesterday she lost the ability see, states happened twice yesterday and 1 other time while she has had this migraine. Pt presents to ED alert and oriented, and able to answer all questions without difficulty. Pt is noted to be mildly diaphoretic in triage at this time.

## 2017-06-07 ENCOUNTER — Ambulatory Visit: Payer: Self-pay | Attending: Oncology | Admitting: *Deleted

## 2017-06-07 VITALS — BP 121/88 | HR 67 | Temp 97.9°F | Ht 64.0 in | Wt 178.0 lb

## 2017-06-07 DIAGNOSIS — N6459 Other signs and symptoms in breast: Secondary | ICD-10-CM

## 2017-06-07 NOTE — Patient Instructions (Signed)
Gave patient hand-out, Women Staying Healthy, Active and Well from [facility], with education on breast health, pap smears, heart and colon health. 

## 2017-06-07 NOTE — Progress Notes (Signed)
Subjective:     Patient ID: Madison Carroll, female   DOB: Nov 04, 1965, 51 y.o.   MRN: [id number]  HPI   Review of Systems     Objective:   Physical Exam  Pulmonary/Chest: Right breast exhibits no inverted nipple, no mass, no nipple discharge, no skin change and no tenderness. Left breast exhibits no inverted nipple, no mass, no nipple discharge, no skin change and no tenderness. Breasts are symmetrical.         Assessment:     51 year old White female presents to [facility] for clinical breast exam and mammogram.  On clinical breast exam I can palpate an asymmetrical 3 cm thickening at the upper outer quadrant of the left breast.  Family history of breast cancer in her mom in her 27's.  Taught self breast awareness.  Patient with a history of total hysterectomy for endometriosis.   No pap or pelvic per protocol.  Patient has been screened for eligibility.  She does not have any insurance, Medicare or Medicaid.  She also meets financial eligibility.  Hand-out given on the Affordable Care Act.    Plan:     Will get bilateral diagnostic mammogram and ultrasound.  Will follow-up per [facility] protocol.

## 2017-06-08 ENCOUNTER — Encounter: Payer: Self-pay | Admitting: *Deleted

## 2017-06-17 ENCOUNTER — Ambulatory Visit
Admission: RE | Admit: 2017-06-17 | Discharge: 2017-06-17 | Disposition: A | Discharge status: Home / Self Care | Payer: Self-pay | Source: Ambulatory Visit | Attending: Oncology | Admitting: Oncology

## 2017-06-17 DIAGNOSIS — N6459 Other signs and symptoms in breast: Secondary | ICD-10-CM

## 2017-06-18 ENCOUNTER — Encounter: Payer: Self-pay | Admitting: *Deleted

## 2017-06-18 NOTE — Progress Notes (Signed)
Patient with benign mammogram.  Letter mailed from the [facility] to inform patient of her normal mammogram results.  Patient is to follow-up with annual screening in one year.Marland Kitchen  [facility] to [place].

## 2017-11-10 ENCOUNTER — Encounter: Payer: Self-pay | Admitting: Emergency Medicine

## 2017-11-10 ENCOUNTER — Emergency Department: Payer: Self-pay

## 2017-11-10 ENCOUNTER — Emergency Department
Admission: EM | Admit: 2017-11-10 | Discharge: 2017-11-10 | Disposition: A | Discharge status: Home / Self Care | Payer: Self-pay | Attending: Student in an Organized Health Care Education/Training Program | Admitting: Student in an Organized Health Care Education/Training Program

## 2017-11-10 DIAGNOSIS — F17210 Nicotine dependence, cigarettes, uncomplicated: Secondary | ICD-10-CM | POA: Insufficient documentation

## 2017-11-10 DIAGNOSIS — S0181XA Laceration without foreign body of other part of head, initial encounter: Secondary | ICD-10-CM | POA: Insufficient documentation

## 2017-11-10 DIAGNOSIS — Z23 Encounter for immunization: Secondary | ICD-10-CM | POA: Insufficient documentation

## 2017-11-10 DIAGNOSIS — Y939 Activity, unspecified: Secondary | ICD-10-CM | POA: Insufficient documentation

## 2017-11-10 DIAGNOSIS — Y92000 Kitchen of unspecified non-institutional (private) residence as  the place of occurrence of the external cause: Secondary | ICD-10-CM | POA: Insufficient documentation

## 2017-11-10 DIAGNOSIS — W228XXA Striking against or struck by other objects, initial encounter: Secondary | ICD-10-CM | POA: Insufficient documentation

## 2017-11-10 DIAGNOSIS — Y999 Unspecified external cause status: Secondary | ICD-10-CM | POA: Insufficient documentation

## 2017-11-10 DIAGNOSIS — S0990XA Unspecified injury of head, initial encounter: Secondary | ICD-10-CM

## 2017-11-10 MED ORDER — BACITRACIN ZINC 500 UNIT/GM EX OINT
TOPICAL_OINTMENT | CUTANEOUS | Status: AC
  Filled 2017-11-10: qty 0.9

## 2017-11-10 MED ORDER — BACITRACIN ZINC 500 UNIT/GM EX OINT
TOPICAL_OINTMENT | Freq: Two times a day (BID) | CUTANEOUS | Status: DC
  Administered 2017-11-10: 1 via TOPICAL

## 2017-11-10 MED ORDER — TETANUS-DIPHTH-ACELL PERTUSSIS 5-2.5-18.5 LF-MCG/0.5 IM SUSP
0.5000 mL | Freq: Once | INTRAMUSCULAR | Status: AC
  Administered 2017-11-10: 0.5 mL via INTRAMUSCULAR
  Filled 2017-11-10: qty 0.5

## 2017-11-10 NOTE — ED Triage Notes (Signed)
Pt comes into the ED via POV c/o laceration to the left side of the forehead from where some pans fell on her from upper cabinets.  Denies any LOC and patient is neurologically intact. All bleeding under control at this time.

## 2017-11-10 NOTE — ED Notes (Signed)
Small knot to left forehead. Pt reports baking pans fell and hit head. No LOC or vomiting. Small abrasion noted.

## 2017-11-10 NOTE — Discharge Instructions (Signed)
Follow-up with your regular doctor for a blood pressure check in 2-3 days.  Your doctor may need to prescribe you blood pressure medication.  Your initial blood pressure reading here in the emergency department was 180/101.  Her second blood pressure reading was 161/89.  Take Tylenol or ibuprofen for the headache.  Have your provider evaluate the CT result from today.  It is negative for any acute abnormality today.  There are chronic changes

## 2017-11-10 NOTE — ED Provider Notes (Signed)
[facility] Emergency Department Provider Note  ____________________________________________   First MD Initiated Contact with Patient 11/10/17 1745     (approximate)  I have reviewed the triage vital signs and the nursing notes.   HISTORY  Chief Complaint Laceration    HPI Gabriel CirriLynne Franklyn is a 52 y.o. female presents to the emergency department complaining of a knot to the left side of her forehead.  Baking pans fell and hit her head when she opened a cabinet.  She has abrasion to the forehead and swelling.  She denies loss of consciousness.  States she has a headache and nausea.  Her friend that is with her states that she has been acting normally.  Past Medical History:  Diagnosis Date  . Anxiety   . Bipolar 1 disorder (HCC)     There are no active problems to display for this patient.   Past Surgical History:  Procedure Laterality Date  . ABDOMINAL HYSTERECTOMY      Prior to Admission medications   Medication Sig Start Date End Date Taking? Authorizing Provider  famotidine (PEPCID) 20 MG tablet Take 1 tablet (20 mg total) by mouth daily as needed (together with steroids). 05/12/17 05/17/17  Nita SickleVeronese, [place], MD  ibuprofen (ADVIL,MOTRIN) 600 MG tablet Take 1 tablet (600 mg total) by mouth every 6 (six) hours as needed. 05/12/17   Nita SickleVeronese, [place], MD    Allergies Codeine; Sulfa antibiotics; and Zofran [ondansetron hcl]  Family History  Problem Relation Age of Onset  . Breast cancer Mother [id number]    Social History Social History   Tobacco Use  . Smoking status: Current Every Day Smoker    Types: Cigarettes  . Smokeless tobacco: Never Used  Substance Use Topics  . Alcohol use: No  . Drug use: No    Review of Systems  Constitutional: No fever/chills, positive for headache, negative loss of consciousness Eyes: No visual changes. ENT: No sore throat. Respiratory: Denies cough Or intestinal: Denies vomiting, positive  nausea Genitourinary: Negative for dysuria. Musculoskeletal: Negative for back pain. Skin: Negative for rash.    ____________________________________________   PHYSICAL EXAM:  VITAL SIGNS: ED Triage Vitals  Enc Vitals Group     BP 11/10/17 1738 (!) 180/101     Pulse Rate 11/10/17 1738 80     Resp 11/10/17 1738 16     Temp 11/10/17 1738 98.6 F (37 C)     Temp Source 11/10/17 1738 Oral     SpO2 11/10/17 1738 98 %     Weight 11/10/17 1736 150 lb (68 kg)     Height 11/10/17 1736 5\' 3"  (1.6 m)     Head Circumference --      Peak Flow --      Pain Score 11/10/17 1736 8     Pain Loc --      Pain Edu? --      Excl. in GC? --     Constitutional: Alert and oriented. Well appearing and in no acute distress. Eyes: Conjunctivae are normal.  perrl Head: Positive for a large knot on the left side of the forehead, abrasion on the same area Nose: No congestion/rhinnorhea. Mouth/Throat: Mucous membranes are moist.   Cardiovascular: Normal rate, regular rhythm.  Heart sounds are normal Respiratory: Normal respiratory effort.  No retractions, lungs are clear to auscultation GU: deferred Musculoskeletal: FROM all extremities, warm and well perfused Neurologic:  Normal speech and language.  Cranial nerves II through XII grossly intact Skin:  Skin is warm,  dry , positive for abrasion the left side of the forehead  psychiatric: Mood and affect are normal. Speech and behavior are normal.  ____________________________________________   LABS (all labs ordered are listed, but only abnormal results are displayed)  Labs Reviewed - No data to display ____________________________________________   ____________________________________________  RADIOLOGY  CT the head is negative for any acute abnormality.  There are chronic changes.  Hematoma to the forehead and laceration to the forehead  ____________________________________________   PROCEDURES  Procedure(s) performed:  No  Procedures    ____________________________________________   INITIAL IMPRESSION / ASSESSMENT AND PLAN / ED COURSE  Pertinent labs & imaging results that were available during my care of the patient were reviewed by me and considered in my medical decision making (see chart for details).  Patient's 52 year old female presents emergency department with head injury.  Physical exam she has a large knot on left side of the forehead.  She is neurologically intact.  CT of the head is negative for any acute abnormalities.  There are chronic changes.  The area was cleaned with saline, no sutures or tissue adhesive are needed.  CT results were discussed with patient.  She is to follow-up with regular doctor for the chronic changes noted on the CT.  She is to follow-up with her regular doctor due to the elevated blood pressure.  Told her that she could have ischemic changes in the brain due to her blood pressure being elevated chronically.  States she understands will follow-up with her doctor.  She is given head injury instructions and hypertension instructions.  She is to return to the emergency department she is worsening.  Patient was discharged in stable condition     As part of my medical decision making, I reviewed the following data within the electronic MEDICAL RECORD NUMBER Nursing notes reviewed and incorporated, Old chart reviewed, Radiograph reviewed CT of the head is negative for acute brain injury, Notes from prior ED visits and [place] Controlled Substance Database  ____________________________________________   FINAL CLINICAL IMPRESSION(S) / ED DIAGNOSES  Final diagnoses:  Injury of head, initial encounter  Facial laceration, initial encounter      NEW MEDICATIONS STARTED DURING THIS VISIT:  New Prescriptions   No medications on file     Note:  This document was prepared using Dragon voice recognition software and may include unintentional dictation errors.    Faythe Ghee, PA-C 11/10/17 1914    Willy Eddy, MD 11/10/17 [phone]

## 2018-07-16 ENCOUNTER — Encounter: Payer: Self-pay | Admitting: Emergency Medicine

## 2018-07-16 ENCOUNTER — Other Ambulatory Visit: Payer: Self-pay

## 2018-07-16 ENCOUNTER — Emergency Department: Payer: Self-pay

## 2018-07-16 ENCOUNTER — Emergency Department
Admission: EM | Admit: 2018-07-16 | Discharge: 2018-07-16 | Disposition: A | Discharge status: Home / Self Care | Payer: Self-pay | Attending: Emergency Medicine | Admitting: Emergency Medicine

## 2018-07-16 DIAGNOSIS — R42 Dizziness and giddiness: Secondary | ICD-10-CM | POA: Insufficient documentation

## 2018-07-16 DIAGNOSIS — I10 Essential (primary) hypertension: Secondary | ICD-10-CM | POA: Insufficient documentation

## 2018-07-16 DIAGNOSIS — Z5321 Procedure and treatment not carried out due to patient leaving prior to being seen by health care provider: Secondary | ICD-10-CM | POA: Insufficient documentation

## 2018-07-16 DIAGNOSIS — R200 Anesthesia of skin: Secondary | ICD-10-CM | POA: Insufficient documentation

## 2018-07-16 LAB — COMPREHENSIVE METABOLIC PANEL
ALK PHOS: 93 U/L (ref 38–126)
ALT: 16 U/L (ref 0–44)
AST: 20 U/L (ref 15–41)
Albumin: 4.7 g/dL (ref 3.5–5.0)
Anion gap: 11 (ref 5–15)
BILIRUBIN TOTAL: 0.4 mg/dL (ref 0.3–1.2)
BUN: 13 mg/dL (ref 6–20)
CALCIUM: 9.4 mg/dL (ref 8.9–10.3)
CHLORIDE: 105 mmol/L (ref 98–111)
CO2: 25 mmol/L (ref 22–32)
CREATININE: 0.82 mg/dL (ref 0.44–1.00)
Glucose, Bld: 103 mg/dL — ABNORMAL HIGH (ref 70–99)
Potassium: 4.1 mmol/L (ref 3.5–5.1)
Sodium: 141 mmol/L (ref 135–145)
Total Protein: 8.2 g/dL — ABNORMAL HIGH (ref 6.5–8.1)

## 2018-07-16 LAB — CBC
HEMATOCRIT: 38.4 % (ref 36.0–46.0)
Hemoglobin: 12.9 g/dL (ref 12.0–15.0)
MCH: 30.9 pg (ref 26.0–34.0)
MCHC: 33.6 g/dL (ref 30.0–36.0)
MCV: 92.1 fL (ref 80.0–100.0)
PLATELETS: 310 10*3/uL (ref 150–400)
RBC: 4.17 MIL/uL (ref 3.87–5.11)
RDW: 12.4 % (ref 11.5–15.5)
WBC: 8.1 10*3/uL (ref 4.0–10.5)
nRBC: 0 % (ref 0.0–0.2)

## 2018-07-16 LAB — TROPONIN I

## 2018-07-16 NOTE — ED Triage Notes (Signed)
First Nurse Note:  C/O elevated BP for past several days.  Also c/o dizziness.  AAOx3.  Skin warm and dry.  Ambulates with easy and steady gait.  NAD.

## 2018-07-16 NOTE — ED Triage Notes (Addendum)
States checks blood pressure at home and noted elevated readings x 2 days. States yesterday noted L arm and leg numbness, today notes only L leg numbness. Smile symmetrical, grips equal with not drift when held up for 10 seconds, leg strength equal. Headache x 2 days. At conclusion of triage, states also dizziness and chest pain.

## 2018-07-18 ENCOUNTER — Telehealth: Payer: Self-pay | Admitting: Emergency Medicine

## 2018-07-18 NOTE — Telephone Encounter (Signed)
Called patient due to lwot to inquire about condition and follow up plans. Left message.   

## 2018-10-02 ENCOUNTER — Emergency Department
Admission: EM | Admit: 2018-10-02 | Discharge: 2018-10-02 | Disposition: A | Discharge status: Home / Self Care | Payer: Self-pay | Attending: Emergency Medicine | Admitting: Emergency Medicine

## 2018-10-02 ENCOUNTER — Encounter: Payer: Self-pay | Admitting: Medical Oncology

## 2018-10-02 ENCOUNTER — Emergency Department: Payer: Self-pay

## 2018-10-02 DIAGNOSIS — R030 Elevated blood-pressure reading, without diagnosis of hypertension: Secondary | ICD-10-CM | POA: Insufficient documentation

## 2018-10-02 DIAGNOSIS — R079 Chest pain, unspecified: Secondary | ICD-10-CM | POA: Insufficient documentation

## 2018-10-02 DIAGNOSIS — F17210 Nicotine dependence, cigarettes, uncomplicated: Secondary | ICD-10-CM | POA: Insufficient documentation

## 2018-10-02 DIAGNOSIS — Z79899 Other long term (current) drug therapy: Secondary | ICD-10-CM | POA: Insufficient documentation

## 2018-10-02 LAB — CBC
HCT: 35.8 % — ABNORMAL LOW (ref 36.0–46.0)
Hemoglobin: 11.8 g/dL — ABNORMAL LOW (ref 12.0–15.0)
MCH: 30.4 pg (ref 26.0–34.0)
MCHC: 33 g/dL (ref 30.0–36.0)
MCV: 92.3 fL (ref 80.0–100.0)
Platelets: 284 10*3/uL (ref 150–400)
RBC: 3.88 MIL/uL (ref 3.87–5.11)
RDW: 12.4 % (ref 11.5–15.5)
WBC: 7.5 10*3/uL (ref 4.0–10.5)
nRBC: 0 % (ref 0.0–0.2)

## 2018-10-02 LAB — BASIC METABOLIC PANEL
Anion gap: 7 (ref 5–15)
BUN: 11 mg/dL (ref 6–20)
CO2: 25 mmol/L (ref 22–32)
Calcium: 8.9 mg/dL (ref 8.9–10.3)
Chloride: 107 mmol/L (ref 98–111)
Creatinine, Ser: 0.78 mg/dL (ref 0.44–1.00)
GFR calc Af Amer: >60 mL/min (ref >60)
GLUCOSE: 99 mg/dL (ref 70–99)
Potassium: 3.4 mmol/L — ABNORMAL LOW (ref 3.5–5.1)
Sodium: 139 mmol/L (ref 135–145)

## 2018-10-02 LAB — TROPONIN I: Troponin I: <0.03 ng/mL (ref <0.03)

## 2018-10-02 MED ORDER — ASPIRIN 81 MG PO CHEW
324.0000 mg | CHEWABLE_TABLET | Freq: Once | ORAL | Status: AC
  Administered 2018-10-02: 324 mg via ORAL
  Filled 2018-10-02: qty 4

## 2018-10-02 NOTE — ED Provider Notes (Signed)
[facility] Emergency Department Provider Note  ____________________________________________   First MD Initiated Contact with Patient 10/02/18 [phone]     (approximate)  I have reviewed the triage vital signs and the nursing notes.   HISTORY  Chief Complaint Chest Pain and Hypertension    HPI Mennie Maden is a 53 y.o. female reports a history of anxiety  Patient reports over the last month she is checked her blood pressure at home and sometimes it will be fairly normal but at other times it will be high.  She also reports that for about 2 days now she is had a little bit of a feeling of a "bubble" feeling that comes and goes in the middle of her chest.  Nothing seems to make it better or worse.  No change with walking.  No fevers or chills.  No cough or shortness of breath.  Denies history of heart disease.  She does report when she notices similar symptoms as when she is anxious, and also at times when she is anxious she will check her blood pressure and I will be as high as 180-190, but then thinks it comes down  Past Medical History:  Diagnosis Date  . Anxiety   . Bipolar 1 disorder (HCC)     There are no active problems to display for this patient.   Past Surgical History:  Procedure Laterality Date  . ABDOMINAL HYSTERECTOMY      Prior to Admission medications   Medication Sig Start Date End Date Taking? Authorizing Provider  alprazolam Prudy Feeler) 2 MG tablet Take 2 mg by mouth 4 (four) times daily -  before meals and at bedtime. 06/29/18  Yes [provider]  gabapentin (NEURONTIN) 300 MG capsule Take 600 mg by mouth 3 (three) times daily.   Yes [provider]  lamoTRIgine (LAMICTAL) 150 MG tablet Take 150 mg by mouth daily. 06/29/18  Yes [provider]    Allergies Metoclopramide; Codeine; Sulfa antibiotics; and Zofran [ondansetron hcl]  Family History  Problem Relation Age of Onset  . Breast cancer Mother 7      Social History Social History   Tobacco Use  . Smoking status: Current Every Day Smoker    Packs/day: 0.50    Types: Cigarettes  . Smokeless tobacco: Never Used  Substance Use Topics  . Alcohol use: No  . Drug use: No    Review of Systems Constitutional: No fever/chills Eyes: No visual changes. ENT: No sore throat. Cardiovascular: Denies chest pain but rather describes a feeling of bubble that comes and goes in the chest. Respiratory: Denies shortness of breath. Gastrointestinal: No abdominal pain.   Genitourinary: Negative for dysuria.  Denies pregnancy. Musculoskeletal: Negative for back pain. Skin: Negative for rash. Neurological: Negative for headaches, areas of focal weakness or numbness.    ____________________________________________   PHYSICAL EXAM:  VITAL SIGNS: ED Triage Vitals [10/02/18 0817]  Enc Vitals Group     BP (!) 147/95     Pulse Rate 91     Resp 18     Temp 97.9 F (36.6 C)     Temp Source Oral     SpO2 100 %     Weight 160 lb (72.6 kg)     Height 5\' 3"  (1.6 m)     Head Circumference      Peak Flow      Pain Score 8     Pain Loc      Pain Edu?  Excl. in GC?     Constitutional: Alert and oriented. Well appearing and in no acute distress. Eyes: Conjunctivae are normal. Head: Atraumatic. Neck: No stridor.  Cardiovascular: Normal rate, regular rhythm. Grossly normal heart sounds.  Good peripheral circulation. Respiratory: Normal respiratory effort.  No retractions. Lungs CTAB. Gastrointestinal: Soft and nontender. No distention. Musculoskeletal: No lower extremity tenderness nor edema. Neurologic:  Normal speech and language. No gross focal neurologic deficits are appreciated.  Skin:  Skin is warm, dry and intact. No rash noted. Psychiatric: Mood and affect are calm, very pleasant. Speech and behavior are normal.  ____________________________________________   LABS (all labs ordered are listed, but only abnormal results are  displayed)  Labs Reviewed  BASIC METABOLIC PANEL - Abnormal; Notable for the following components:      Result Value   Potassium 3.4 (*)    All other components within normal limits  CBC - Abnormal; Notable for the following components:   Hemoglobin 11.8 (*)    HCT 35.8 (*)    All other components within normal limits  TROPONIN I  TROPONIN I   ____________________________________________  EKG  EKG reviewed interpreted by me at 820 Heart rate 90 QRS 80 QTc 450 Normal sinus rhythm, no evidence acute ischemia  Repeat EKG reviewed at 8:26 AM Heart rate 80 QRS 100 QTc 430 Normal sinus rhythm no evidence of ischemia or ectopy ____________________________________________  RADIOLOGY  Chest x-ray reviewed negative for acute ____________________________________________   PROCEDURES  Procedure(s) performed: None  Procedures  Critical Care performed: No  ____________________________________________   INITIAL IMPRESSION / ASSESSMENT AND PLAN / ED COURSE  Pertinent labs & imaging results that were available during my care of the patient were reviewed by me and considered in my medical decision making (see chart for details).   Differential diagnosis includes, but is not limited to, ACS, aortic dissection, pulmonary embolism, cardiac tamponade, pneumothorax, pneumonia, pericarditis, myocarditis, GI-related causes including esophagitis/gastritis, and musculoskeletal chest wall pain.    Regarding the patient's hypertension, her blood pressure here is only moderately elevated and it sounds potentially that her blood pressure elevated she may be experiencing some element of "whitecoat" hypertension or anxiety, not confident that she has been truly diagnosed hypertension in need of medication therapy at this time.  Additionally, regarding her chest pain, she is low risk for coronary disease.  Age is seems to be her primary risk factor.  Denies strong family history, no history of  hypertension, hyperlipidemia or diabetes.  EKG and first troponin reassuring.  Her clinical history atypical for ACS.  Clinical Course as of Oct 02 1214  Sun Oct 02, 2018  1052 Patient is resting comfortably.  Reports she is doing well, no further pain or discomfort.  Blood pressure 140 systolic.  Resting comfortably understanding that we will observe her till about 1130 and repeat enzyme at that time   [MQ]    Clinical Course User Index [MQ] Sharyn CreamerQuale, Mark, MD   ----------------------------------------- 12:16 PM on 10/02/2018 -----------------------------------------  Resting comfortably no distress.  Low risk for ACS at this time, 2 normal troponins.  Patient agreement with follow-up with primary care, will contact cardiology Monday for close follow-up as well.  Reviewed careful return precautions.  Return precautions and treatment recommendations and follow-up discussed with the patient who is agreeable with the plan.   ____________________________________________   FINAL CLINICAL IMPRESSION(S) / ED DIAGNOSES  Final diagnoses:  Chest pain with low risk for cardiac etiology        Note:  This document was prepared using Conservation officer, historic buildings and may include unintentional dictation errors       Sharyn Creamer, MD 10/02/18 1216

## 2018-10-02 NOTE — Discharge Instructions (Signed)

## 2018-10-02 NOTE — ED Triage Notes (Signed)
Pt reports for a few days she has been feeling chest pressure, having dizziness, and states that her BP has been high for the past 2 days. Denies hx of HTN.

## 2018-10-30 ENCOUNTER — Encounter: Payer: Self-pay | Admitting: Emergency Medicine

## 2018-10-30 ENCOUNTER — Other Ambulatory Visit: Payer: Self-pay

## 2018-10-30 ENCOUNTER — Emergency Department: Payer: Self-pay

## 2018-10-30 ENCOUNTER — Emergency Department
Admission: EM | Admit: 2018-10-30 | Discharge: 2018-10-30 | Disposition: A | Discharge status: Home / Self Care | Payer: Self-pay | Attending: Emergency Medicine | Admitting: Emergency Medicine

## 2018-10-30 DIAGNOSIS — F17210 Nicotine dependence, cigarettes, uncomplicated: Secondary | ICD-10-CM | POA: Insufficient documentation

## 2018-10-30 DIAGNOSIS — R079 Chest pain, unspecified: Secondary | ICD-10-CM

## 2018-10-30 DIAGNOSIS — R0789 Other chest pain: Secondary | ICD-10-CM | POA: Insufficient documentation

## 2018-10-30 DIAGNOSIS — Z79899 Other long term (current) drug therapy: Secondary | ICD-10-CM | POA: Insufficient documentation

## 2018-10-30 DIAGNOSIS — R0602 Shortness of breath: Secondary | ICD-10-CM | POA: Insufficient documentation

## 2018-10-30 LAB — CBC
HCT: 40.9 % (ref 36.0–46.0)
Hemoglobin: 13.3 g/dL (ref 12.0–15.0)
MCH: 30.1 pg (ref 26.0–34.0)
MCHC: 32.5 g/dL (ref 30.0–36.0)
MCV: 92.5 fL (ref 80.0–100.0)
PLATELETS: 318 10*3/uL (ref 150–400)
RBC: 4.42 MIL/uL (ref 3.87–5.11)
RDW: 12.4 % (ref 11.5–15.5)
WBC: 8.5 10*3/uL (ref 4.0–10.5)
nRBC: 0 % (ref 0.0–0.2)

## 2018-10-30 LAB — BASIC METABOLIC PANEL
Anion gap: 9 (ref 5–15)
BUN: 12 mg/dL (ref 6–20)
CALCIUM: 9.5 mg/dL (ref 8.9–10.3)
CO2: 22 mmol/L (ref 22–32)
Chloride: 109 mmol/L (ref 98–111)
Creatinine, Ser: 0.83 mg/dL (ref 0.44–1.00)
GFR calc Af Amer: >60 mL/min (ref >60)
GFR calc non Af Amer: >60 mL/min (ref >60)
Glucose, Bld: 99 mg/dL (ref 70–99)
Potassium: 4.3 mmol/L (ref 3.5–5.1)
Sodium: 140 mmol/L (ref 135–145)

## 2018-10-30 LAB — TROPONIN I
Troponin I: <0.03 ng/mL (ref <0.03)
Troponin I: <0.03 ng/mL (ref <0.03)

## 2018-10-30 NOTE — Discharge Instructions (Signed)
The test that we did here today were essentially negative.  It may be that she had a panic attack however to be safe I would like you to follow-up with cardiology.  Please call Dr. Gwen Pounds in the morning and arrange follow-up with him.  He should be out of see very quickly.  Please return here if you have increasing chest tightness or shortness of breath.

## 2018-10-30 NOTE — ED Provider Notes (Signed)
[facility] Emergency Department Provider Note   ____________________________________________   First MD Initiated Contact with Patient 10/30/18 1332     (approximate)  I have reviewed the triage vital signs and the nursing notes.   HISTORY  Chief Complaint Chest Pain and Shortness of Breath    HPI Madison Carroll is a 53 y.o. female patient reports she has been feeling tired and short of breath for the last few days.  Short of breath gets worse with exertion.  Yesterday she had episodes where the she felt an elephant sitting on her chest.  Today she has had the same feeling all day.  She is also short of breath with it.  She thinks maybe she is having panic attacks.  She is a smoker.  Pain does not really radiate.  Seems to be somewhat worse with exertion.  It is moderate.  She does not describe radiation.   Past Medical History:  Diagnosis Date  . Anxiety   . Bipolar 1 disorder (HCC)     There are no active problems to display for this patient.   Past Surgical History:  Procedure Laterality Date  . ABDOMINAL HYSTERECTOMY      Prior to Admission medications   Medication Sig Start Date End Date Taking? Authorizing Provider  alprazolam Prudy Feeler) 2 MG tablet Take 2 mg by mouth 4 (four) times daily -  before meals and at bedtime. 06/29/18   [provider]  gabapentin (NEURONTIN) 300 MG capsule Take 600 mg by mouth 3 (three) times daily.    [provider]  lamoTRIgine (LAMICTAL) 150 MG tablet Take 150 mg by mouth daily. 06/29/18   [provider]    Allergies Metoclopramide; Codeine; Sulfa antibiotics; and Zofran [ondansetron hcl]  Family History  Problem Relation Age of Onset  . Breast cancer Mother 48    Social History Social History   Tobacco Use  . Smoking status: Current Every Day Smoker    Packs/day: 0.50    Types: Cigarettes  . Smokeless tobacco: Never Used  Substance Use Topics  . Alcohol use: No  .  Drug use: No    Review of Systems  Constitutional: No fever/chills Eyes: No visual changes. ENT: No sore throat. Cardiovascular: See HPI Respiratory: See HPI. Gastrointestinal: No abdominal pain.  No nausea, no vomiting.  No diarrhea.  No constipation. Genitourinary: Negative for dysuria. Musculoskeletal: Negative for back pain. Skin: Negative for rash. Neurological: Negative for headaches, focal weakness   ____________________________________________   PHYSICAL EXAM:  VITAL SIGNS: ED Triage Vitals  Enc Vitals Group     BP 10/30/18 1009 119/75     Pulse Rate 10/30/18 1009 72     Resp 10/30/18 1009 20     Temp 10/30/18 1009 98.4 F (36.9 C)     Temp Source 10/30/18 1009 Oral     SpO2 10/30/18 1009 100 %     Weight 10/30/18 1008 160 lb (72.6 kg)     Height 10/30/18 1008 5\' 4"  (1.626 m)     Head Circumference --      Peak Flow --      Pain Score 10/30/18 1011 8     Pain Loc --      Pain Edu? --      Excl. in GC? --     Constitutional: Alert and oriented. Well appearing and in no acute distress. Eyes: Conjunctivae are normal. Head: Atraumatic. Nose: No congestion/rhinnorhea. Mouth/Throat: Mucous membranes are moist.  Oropharynx non-erythematous. Neck:  No stridor.  Cardiovascular: Normal rate, regular rhythm. Grossly normal heart sounds.  Good peripheral circulation. Respiratory: Normal respiratory effort.  No retractions. Lungs CTAB. Gastrointestinal: Soft and nontender. No distention. No abdominal bruits. No CVA tenderness. Musculoskeletal: No lower extremity tenderness nor edema.  No joint effusions. Neurologic:  Normal speech and language. No gross focal neurologic deficits are appreciated.. Skin:  Skin is warm, dry and intact. No rash noted. Psychiatric: Mood and affect are normal. Speech and behavior are normal.  ____________________________________________   LABS (all labs ordered are listed, but only abnormal results are displayed)  Labs Reviewed  BASIC  METABOLIC PANEL  CBC  TROPONIN I  TROPONIN I  POC URINE PREG, ED   ____________________________________________  EKG  EKG read interpreted by me shows normal sinus rhythm rate of 85 left axis no acute ST-T changes ____________________________________________  RADIOLOGY  ED MD interpretation: Chest x-ray read by radiology reviewed by me is normal  Official radiology report(s): Dg Chest 2 View  Result Date: 10/30/2018 CLINICAL DATA:  Right-sided chest pain with shortness of breath for 1 day. Current smoker. EXAM: CHEST - 2 VIEW COMPARISON:  10/02/2018 and 05/12/2017. FINDINGS: The heart size and mediastinal contours are stable. There is stable mild scarring at the left lung base. The lungs are otherwise clear. There is no pleural effusion or pneumothorax. No acute osseous findings are evident. IMPRESSION: Stable chest.  No active cardiopulmonary process. Electronically Signed   By: Carey Bullocks M.D.   On: 10/30/2018 11:35    ____________________________________________   PROCEDURES  Procedure(s) performed:   Procedures  Critical Care performed:   ____________________________________________   INITIAL IMPRESSION / ASSESSMENT AND PLAN / ED COURSE Patient's troponins are negative.  Her EKG is not showing anything that could be related to lead placement.  I will have her follow-up with cardiology return if she is worse.  She thinks it is anxiety it is a good chance it could be.           ____________________________________________   FINAL CLINICAL IMPRESSION(S) / ED DIAGNOSES  Final diagnoses:  Chest pain, unspecified type     ED Discharge Orders    None       Note:  This document was prepared using Dragon voice recognition software and may include unintentional dictation errors.    Arnaldo Natal, MD 10/30/18 [phone]

## 2018-10-30 NOTE — ED Notes (Signed)
Pt updated on wait time.  

## 2018-10-30 NOTE — ED Triage Notes (Signed)
Pt presents c/o R-sided chest pain and shortness starting yesterday.

## 2019-06-08 ENCOUNTER — Other Ambulatory Visit: Payer: Self-pay

## 2019-06-08 ENCOUNTER — Encounter: Payer: Self-pay | Admitting: Emergency Medicine

## 2019-06-08 ENCOUNTER — Emergency Department: Payer: Self-pay

## 2019-06-08 ENCOUNTER — Emergency Department
Admission: EM | Admit: 2019-06-08 | Discharge: 2019-06-08 | Disposition: A | Discharge status: Home / Self Care | Payer: Self-pay | Attending: Student | Admitting: Student

## 2019-06-08 DIAGNOSIS — F17210 Nicotine dependence, cigarettes, uncomplicated: Secondary | ICD-10-CM | POA: Insufficient documentation

## 2019-06-08 DIAGNOSIS — R0789 Other chest pain: Secondary | ICD-10-CM | POA: Insufficient documentation

## 2019-06-08 DIAGNOSIS — R079 Chest pain, unspecified: Secondary | ICD-10-CM

## 2019-06-08 HISTORY — DX: Nonrheumatic mitral (valve) prolapse: I34.1

## 2019-06-08 LAB — BASIC METABOLIC PANEL
Anion gap: 13 (ref 5–15)
BUN: 9 mg/dL (ref 6–20)
CO2: 25 mmol/L (ref 22–32)
Calcium: 9.2 mg/dL (ref 8.9–10.3)
Chloride: 101 mmol/L (ref 98–111)
Creatinine, Ser: 0.84 mg/dL (ref 0.44–1.00)
GFR calc Af Amer: >60 mL/min (ref >60)
GFR calc non Af Amer: >60 mL/min (ref >60)
Glucose, Bld: 104 mg/dL — ABNORMAL HIGH (ref 70–99)
Potassium: 4.4 mmol/L (ref 3.5–5.1)
Sodium: 139 mmol/L (ref 135–145)

## 2019-06-08 LAB — CBC
HCT: 36.5 % (ref 36.0–46.0)
Hemoglobin: 12.2 g/dL (ref 12.0–15.0)
MCH: 31.4 pg (ref 26.0–34.0)
MCHC: 33.4 g/dL (ref 30.0–36.0)
MCV: 93.8 fL (ref 80.0–100.0)
Platelets: 293 10*3/uL (ref 150–400)
RBC: 3.89 MIL/uL (ref 3.87–5.11)
RDW: 13.2 % (ref 11.5–15.5)
WBC: 8.6 10*3/uL (ref 4.0–10.5)
nRBC: 0 % (ref 0.0–0.2)

## 2019-06-08 LAB — TROPONIN I (HIGH SENSITIVITY): Troponin I (High Sensitivity): <2 ng/L (ref <18)

## 2019-06-08 MED ORDER — KETOROLAC TROMETHAMINE 30 MG/ML IJ SOLN
30.0000 mg | Freq: Once | INTRAMUSCULAR | Status: AC
  Administered 2019-06-08: 30 mg via INTRAMUSCULAR
  Filled 2019-06-08: qty 1

## 2019-06-08 MED ORDER — SODIUM CHLORIDE 0.9% FLUSH: 3.0000 mL | Freq: Once | INTRAVENOUS | Status: DC

## 2019-06-08 MED ORDER — ASPIRIN 81 MG PO CHEW
324.0000 mg | CHEWABLE_TABLET | Freq: Once | ORAL | Status: AC
  Administered 2019-06-08: 324 mg via ORAL
  Filled 2019-06-08: qty 4

## 2019-06-08 MED ORDER — PROMETHAZINE HCL 25 MG PO TABS
25.0000 mg | ORAL_TABLET | Freq: Once | ORAL | Status: AC
  Administered 2019-06-08: 13:00:00 25 mg via ORAL
  Filled 2019-06-08: qty 1

## 2019-06-08 NOTE — Discharge Instructions (Signed)
Thank you for letting us take care of you in the emergency department today.   Please continue to take any regular, prescribed medications. Try taking over the counter Aleve as directed on the box to help with your symptoms.    Please follow up with: - Cardiology - Your primary care doctor to review your ER visit and follow up on your symptoms.   Please return to the ER for any new or worsening symptoms.

## 2019-06-08 NOTE — ED Triage Notes (Signed)
Chest pain like a knife throught left breast for 1 week.  aslo goes down left arm.  Has been missing work

## 2019-06-08 NOTE — ED Provider Notes (Signed)
[facility] Emergency Department Provider Note  ____________________________________________   First MD Initiated Contact with Patient 06/08/19 1158     (approximate)  I have reviewed the triage vital signs and the nursing notes.  History  Chief Complaint Chest Pain    HPI Madison Carroll is a 53 y.o. female with history of anxiety, bipolar disorder, tobacco use who presents to the emergency department for chest pain.  She reports the chest pain has been ongoing for 1 week and is constant.  Only really seems to have relief when she is sleeping.  She describes it as a sharp, knifelike sensation.  She localizes it to the left breast area, and occasionally radiates to her left arm.  No alleviating or aggravating components. She has some associate nausea. No SOB or diaphoresis. No history of HTN, HLD, DM. Family history of cardiac disease.  No history of DVT or PE.  No recent travel, no shortness of breath, no cough or hemoptysis, no unilateral leg swelling.    Past Medical Hx Past Medical History:  Diagnosis Date  . Anxiety   . Bipolar 1 disorder (HCC)   . Mitral valve prolapse     Problem List There are no active problems to display for this patient.   Past Surgical Hx Past Surgical History:  Procedure Laterality Date  . ABDOMINAL HYSTERECTOMY      Medications Prior to Admission medications   Medication Sig Start Date End Date Taking? Authorizing Provider  alprazolam Prudy Feeler) 2 MG tablet Take 2 mg by mouth 4 (four) times daily -  before meals and at bedtime. 06/29/18  Yes [provider]  doxepin (SINEQUAN) 25 MG capsule Take 25 mg by mouth at bedtime.   Yes [provider]  gabapentin (NEURONTIN) 300 MG capsule Take 600 mg by mouth 3 (three) times daily.   Yes [provider]  lamoTRIgine (LAMICTAL) 150 MG tablet Take 150 mg by mouth daily. 06/29/18  Yes [provider]    Allergies Metoclopramide, Codeine,  Sulfa antibiotics, and Zofran [ondansetron hcl]  Family Hx Family History  Problem Relation Age of Onset  . Breast cancer Mother 55    Social Hx Social History   Tobacco Use  . Smoking status: Current Every Day Smoker    Packs/day: 0.50    Types: Cigarettes  . Smokeless tobacco: Never Used  Substance Use Topics  . Alcohol use: No  . Drug use: No     Review of Systems  Constitutional: Negative for fever, chills. Eyes: Negative for visual changes. ENT: Negative for sore throat. Cardiovascular: + for chest pain. Respiratory: Negative for shortness of breath. Gastrointestinal: Negative for nausea, vomiting.  Genitourinary: Negative for dysuria. Musculoskeletal: Negative for leg swelling. Skin: Negative for rash. Neurological: Negative for for headaches.   Physical Exam  Vital Signs: ED Triage Vitals [06/08/19 1054]  Enc Vitals Group     BP 121/69     Pulse Rate 72     Resp 14     Temp 98.3 F (36.8 C)     Temp Source Oral     SpO2 100 %     Weight 165 lb (74.8 kg)     Height 5\' 3"  (1.6 m)     Head Circumference      Peak Flow      Pain Score 10     Pain Loc      Pain Edu?      Excl. in GC?     Constitutional:  Alert and oriented.  Head: Normocephalic. Atraumatic. Eyes: Conjunctivae clear. Sclera anicteric. Nose: No congestion. No rhinorrhea. Mouth/Throat: Mucous membranes are moist.  Neck: No stridor.   Cardiovascular: Normal rate, regular rhythm. No murmurs. Extremities well perfused. Chest pain is reproducible with palpation.  Respiratory: Normal respiratory effort.  Lungs CTAB. Gastrointestinal: Soft. Non-tender. Non-distended.  Musculoskeletal: No lower extremity edema. No deformities. Neurologic:  Normal speech and language. No gross focal neurologic deficits are appreciated.  Skin: Skin is warm, dry and intact. No rash noted. No vesicles, blisters, or other lesions.  Psychiatric: Mood and affect are appropriate for situation.  EKG  Personally  reviewed.   Rate: 73 Rhythm: sinus Axis: leftward Intervals: WNL No acute ischemic changes No STEMI    Radiology  CXR: IMPRESSION: No active cardiopulmonary disease   Procedures  Procedure(s) performed (including critical care):  Procedures   Initial Impression / Assessment and Plan / ED Course  53 y.o. female who presents to the ED who presents for chest pain as above.   Ddx: ACS, costochondritis (pain is reproducible with palpation), atypical chest pain. No tachycardia, tachypnea, or hypoxia suggestive of PE. No rashes or lesions s/o shingles.   Work up reveals negative HS troponin. EKG w/o acute ischemic changes.  X-ray negative.  Remainder of labs without actionable derangements.  Low risk HEART score (age, risk factors - tobacco use, family history).  Given negative work-up and low risk HEART score, will plan for discharge with cardiology follow-up.  Patient voices understanding and is comfortable with the plan and discharge.    Final Clinical Impression(s) / ED Diagnosis  Final diagnoses:  Chest pain in adult       Note:  This document was prepared using Dragon voice recognition software and may include unintentional dictation errors.   Lilia Pro., MD 06/08/19 [phone]

## 2019-07-13 ENCOUNTER — Other Ambulatory Visit: Payer: Self-pay

## 2019-07-13 ENCOUNTER — Emergency Department: Payer: Self-pay

## 2019-07-13 ENCOUNTER — Emergency Department
Admission: EM | Admit: 2019-07-13 | Discharge: 2019-07-13 | Disposition: A | Discharge status: Home / Self Care | Payer: Self-pay | Attending: Emergency Medicine | Admitting: Emergency Medicine

## 2019-07-13 DIAGNOSIS — F17210 Nicotine dependence, cigarettes, uncomplicated: Secondary | ICD-10-CM | POA: Insufficient documentation

## 2019-07-13 DIAGNOSIS — Z79899 Other long term (current) drug therapy: Secondary | ICD-10-CM | POA: Insufficient documentation

## 2019-07-13 DIAGNOSIS — M25561 Pain in right knee: Secondary | ICD-10-CM | POA: Insufficient documentation

## 2019-07-13 MED ORDER — PREDNISONE 50 MG PO TABS: ORAL_TABLET | ORAL | 0 refills | Status: DC

## 2019-07-13 MED ORDER — DICLOFENAC SODIUM 1 % TD GEL: 4.0000 g | Freq: Four times a day (QID) | TRANSDERMAL | 0 refills | Status: DC

## 2019-07-13 NOTE — Discharge Instructions (Signed)
Take prednisone once daily for the next 5 days. Apply Voltaren gel daily for the next 5 days.

## 2019-07-13 NOTE — ED Triage Notes (Signed)
Reports right knee swelling, pain and for longer than 1 month. PCP told her to get a brace and she has been wearing without relief of swelling or pain. No initial injury. Pt alert and oriented X4, cooperative, RR even and unlabored, color WNL. Pt in NAD.

## 2019-07-13 NOTE — ED Notes (Signed)
See triage note   Presents with right knee pain and swelling  Was seen by her PCP on the 20th  Placed on mobic and told to wear brace  States her knee is still swelling and now also has swelling to other knee

## 2019-07-13 NOTE — ED Provider Notes (Signed)
[facility] Emergency Department Provider Note  ____________________________________________  Time seen: Approximately 11:17 PM  I have reviewed the triage vital signs and the nursing notes.   HISTORY  Chief Complaint Knee Pain    HPI Madison Carroll is a 53 y.o. female presents to the emergency department with acute right knee pain for the past month.  Patient states that she has had a prior meniscal injury approximately 25 years ago.  She denies recent falls or traumas.  She has not noticed any redness or warmth of the right knee.  She has been able to ambulate but states that her pain is worse with prolonged ambulation.  No other alleviating measures have been attempted.        Past Medical History:  Diagnosis Date  . Anxiety   . Bipolar 1 disorder (HCC)   . Mitral valve prolapse     There are no active problems to display for this patient.   Past Surgical History:  Procedure Laterality Date  . ABDOMINAL HYSTERECTOMY      Prior to Admission medications   Medication Sig Start Date End Date Taking? Authorizing Provider  meloxicam (MOBIC) 15 MG tablet Take 15 mg by mouth daily.   Yes [provider]  alprazolam Prudy Feeler) 2 MG tablet Take 2 mg by mouth 4 (four) times daily -  before meals and at bedtime. 06/29/18   [provider]  diclofenac sodium (VOLTAREN) 1 % GEL Apply 4 g topically 4 (four) times daily. 07/13/19   Orvil Feil, PA-C  doxepin (SINEQUAN) 25 MG capsule Take 25 mg by mouth at bedtime.    [provider]  gabapentin (NEURONTIN) 300 MG capsule Take 600 mg by mouth 3 (three) times daily.    [provider]  lamoTRIgine (LAMICTAL) 150 MG tablet Take 150 mg by mouth daily. 06/29/18   [provider]  predniSONE (DELTASONE) 50 MG tablet Take one tablet daily for the next five days. 07/13/19   Orvil Feil, PA-C    Allergies Metoclopramide, Codeine, Sulfa antibiotics, and Zofran  [ondansetron hcl]  Family History  Problem Relation Age of Onset  . Breast cancer Mother 9    Social History Social History   Tobacco Use  . Smoking status: Current Every Day Smoker    Packs/day: 0.50    Types: Cigarettes  . Smokeless tobacco: Never Used  Substance Use Topics  . Alcohol use: No  . Drug use: No     Review of Systems  Constitutional: No fever/chills Eyes: No visual changes. No discharge ENT: No upper respiratory complaints. Cardiovascular: no chest pain. Respiratory: no cough. No SOB. Gastrointestinal: No abdominal pain.  No nausea, no vomiting.  No diarrhea.  No constipation. Musculoskeletal: Patient has right knee pain. Skin: Negative for rash, abrasions, lacerations, ecchymosis. Neurological: Negative for headaches, focal weakness or numbness.   ____________________________________________   PHYSICAL EXAM:  VITAL SIGNS: ED Triage Vitals [07/13/19 1401]  Enc Vitals Group     BP 133/89     Pulse Rate 91     Resp 16     Temp 99.4 F (37.4 C)     Temp Source Oral     SpO2 100 %     Weight 180 lb (81.6 kg)     Height 5\' 4"  (1.626 m)     Head Circumference      Peak Flow      Pain Score 9     Pain Loc  Pain Edu?      Excl. in GC?      Constitutional: Alert and oriented. Well appearing and in no acute distress. Eyes: Conjunctivae are normal. PERRL. EOMI. Head: Atraumatic. ENT: Cardiovascular: Normal rate, regular rhythm. Normal S1 and S2.  Good peripheral circulation. Respiratory: Normal respiratory effort without tachypnea or retractions. Lungs CTAB. Good air entry to the bases with no decreased or absent breath sounds. Musculoskeletal: Patient has tenderness to palpation along the medial compartment of the right knee.  No other deficits are noted with provocative testing.  No significant ballottement or apprehension of the right knee.  Palpable dorsalis pedis pulse, right. Neurologic:  Normal speech and language. No gross focal  neurologic deficits are appreciated.  Skin:  Skin is warm, dry and intact. No rash noted. Psychiatric: Mood and affect are normal. Speech and behavior are normal. Patient exhibits appropriate insight and judgement.   ____________________________________________   LABS (all labs ordered are listed, but only abnormal results are displayed)  Labs Reviewed - No data to display ____________________________________________  EKG   ____________________________________________  RADIOLOGY I personally viewed and evaluated these images as part of my medical decision making, as well as reviewing the written report by the radiologist.  Dg Knee Complete 4 Views Right  Result Date: 07/13/2019 CLINICAL DATA:  Right knee pain EXAM: RIGHT KNEE - COMPLETE 4+ VIEW COMPARISON:  None. FINDINGS: No fracture or dislocation of the right knee. There is mild tricompartmental joint space narrowing and osteophytosis. No knee joint effusion. No significant soft tissue findings. IMPRESSION: No fracture or dislocation of the right knee. There is mild tricompartmental joint space narrowing and osteophytosis. No knee joint effusion. Electronically Signed   By: Lauralyn PrimesAlex  Bibbey M.D.   On: 07/13/2019 15:25    ____________________________________________    PROCEDURES  Procedure(s) performed:    Procedures    Medications - No data to display   ____________________________________________   INITIAL IMPRESSION / ASSESSMENT AND PLAN / ED COURSE  Pertinent labs & imaging results that were available during my care of the patient were reviewed by me and considered in my medical decision making (see chart for details).  Review of the [place] CSRS was performed in accordance of the [facility] prior to dispensing any controlled drugs.           Assessment and plan Acute right knee pain 53 year old female presents to the emergency department with knee pain for the past month.  Patient was hypertensive at triage but  vital signs were otherwise reassuring.  Patient had tenderness to palpation over the medial aspect of the right knee and some mild joint space narrowing in the medial compartment of the knee.  Patient was discharged with prednisone and Voltaren gel.  She was advised to follow-up with orthopedics.  Return precautions were given.  All patient questions were answered.    ____________________________________________  FINAL CLINICAL IMPRESSION(S) / ED DIAGNOSES  Final diagnoses:  Acute pain of right knee      NEW MEDICATIONS STARTED DURING THIS VISIT:  ED Discharge Orders         Ordered    predniSONE (DELTASONE) 50 MG tablet     07/13/19 1555    diclofenac sodium (VOLTAREN) 1 % GEL  4 times daily     07/13/19 1556              This chart was dictated using voice recognition software/Dragon. Despite best efforts to proofread, errors can occur which can change the meaning. Any change was  purely unintentional.    Lannie Fields, PA-C 07/13/19 Janus Molder    Harvest Dark, MD 07/14/19 [phone]

## 2019-08-28 ENCOUNTER — Other Ambulatory Visit: Payer: Self-pay | Admitting: Surgery

## 2019-08-28 DIAGNOSIS — S83231A Complex tear of medial meniscus, current injury, right knee, initial encounter: Secondary | ICD-10-CM

## 2019-08-28 DIAGNOSIS — M1711 Unilateral primary osteoarthritis, right knee: Secondary | ICD-10-CM

## 2019-08-28 DIAGNOSIS — M25461 Effusion, right knee: Secondary | ICD-10-CM

## 2020-03-25 ENCOUNTER — Other Ambulatory Visit: Payer: Self-pay

## 2020-03-25 ENCOUNTER — Emergency Department: Payer: Self-pay

## 2020-03-25 ENCOUNTER — Encounter: Payer: Self-pay | Admitting: Emergency Medicine

## 2020-03-25 ENCOUNTER — Emergency Department
Admission: EM | Admit: 2020-03-25 | Discharge: 2020-03-25 | Disposition: A | Discharge status: Home / Self Care | Payer: Self-pay | Attending: Emergency Medicine | Admitting: Emergency Medicine

## 2020-03-25 DIAGNOSIS — R0789 Other chest pain: Secondary | ICD-10-CM | POA: Insufficient documentation

## 2020-03-25 DIAGNOSIS — H53149 Visual discomfort, unspecified: Secondary | ICD-10-CM | POA: Insufficient documentation

## 2020-03-25 DIAGNOSIS — G43909 Migraine, unspecified, not intractable, without status migrainosus: Secondary | ICD-10-CM | POA: Insufficient documentation

## 2020-03-25 DIAGNOSIS — F17210 Nicotine dependence, cigarettes, uncomplicated: Secondary | ICD-10-CM | POA: Insufficient documentation

## 2020-03-25 DIAGNOSIS — I341 Nonrheumatic mitral (valve) prolapse: Secondary | ICD-10-CM | POA: Insufficient documentation

## 2020-03-25 LAB — BASIC METABOLIC PANEL
Anion gap: 10 (ref 5–15)
BUN: 10 mg/dL (ref 6–20)
CO2: 20 mmol/L — ABNORMAL LOW (ref 22–32)
Calcium: 8.5 mg/dL — ABNORMAL LOW (ref 8.9–10.3)
Chloride: 106 mmol/L (ref 98–111)
Creatinine, Ser: 0.77 mg/dL (ref 0.44–1.00)
GFR calc Af Amer: >60 mL/min (ref >60)
GFR calc non Af Amer: >60 mL/min (ref >60)
Glucose, Bld: 87 mg/dL (ref 70–99)
Potassium: 4 mmol/L (ref 3.5–5.1)
Sodium: 136 mmol/L (ref 135–145)

## 2020-03-25 LAB — CBC
HCT: 35.1 % — ABNORMAL LOW (ref 36.0–46.0)
Hemoglobin: 11.6 g/dL — ABNORMAL LOW (ref 12.0–15.0)
MCH: 30.8 pg (ref 26.0–34.0)
MCHC: 33 g/dL (ref 30.0–36.0)
MCV: 93.1 fL (ref 80.0–100.0)
Platelets: 273 10*3/uL (ref 150–400)
RBC: 3.77 MIL/uL — ABNORMAL LOW (ref 3.87–5.11)
RDW: 12.9 % (ref 11.5–15.5)
WBC: 10.3 10*3/uL (ref 4.0–10.5)
nRBC: 0 % (ref 0.0–0.2)

## 2020-03-25 LAB — TROPONIN I (HIGH SENSITIVITY): Troponin I (High Sensitivity): 3 ng/L (ref <18)

## 2020-03-25 MED ORDER — DIPHENHYDRAMINE HCL 50 MG/ML IJ SOLN
25.0000 mg | Freq: Once | INTRAMUSCULAR | Status: AC
  Administered 2020-03-25: 25 mg via INTRAVENOUS
  Filled 2020-03-25: qty 1

## 2020-03-25 MED ORDER — KETOROLAC TROMETHAMINE 30 MG/ML IJ SOLN
30.0000 mg | Freq: Once | INTRAMUSCULAR | Status: AC
  Administered 2020-03-25: 30 mg via INTRAVENOUS
  Filled 2020-03-25: qty 1

## 2020-03-25 MED ORDER — PROMETHAZINE HCL 25 MG/ML IJ SOLN
25.0000 mg | Freq: Once | INTRAMUSCULAR | Status: AC
  Administered 2020-03-25: 25 mg via INTRAVENOUS
  Filled 2020-03-25: qty 1

## 2020-03-25 MED ORDER — SODIUM CHLORIDE 0.9 % IV SOLN
1000.0000 mL | Freq: Once | INTRAVENOUS | Status: AC
  Administered 2020-03-25: 1000 mL via INTRAVENOUS

## 2020-03-25 MED ORDER — IOHEXOL 350 MG/ML SOLN
75.0000 mL | Freq: Once | INTRAVENOUS | Status: AC | PRN
  Administered 2020-03-25: 75 mL via INTRAVENOUS
  Filled 2020-03-25: qty 75

## 2020-03-25 MED ORDER — PROMETHAZINE HCL 12.5 MG PO TABS: 12.5000 mg | ORAL_TABLET | Freq: Four times a day (QID) | ORAL | 0 refills | Status: DC | PRN

## 2020-03-25 MED ORDER — MORPHINE SULFATE (PF) 4 MG/ML IV SOLN
4.0000 mg | Freq: Once | INTRAVENOUS | Status: AC
  Administered 2020-03-25: 4 mg via INTRAVENOUS
  Filled 2020-03-25: qty 1

## 2020-03-25 MED ORDER — PROMETHAZINE HCL 25 MG/ML IJ SOLN
12.5000 mg | Freq: Once | INTRAMUSCULAR | Status: AC
  Administered 2020-03-25: 12.5 mg via INTRAVENOUS
  Filled 2020-03-25: qty 1

## 2020-03-25 NOTE — ED Triage Notes (Signed)
Here for left side chest pain that goes to left arm and back.  Only other sx is dizziness.  Unlabored, VSS. Color WNL.  No hx of similar pain.

## 2020-03-25 NOTE — ED Triage Notes (Signed)
First Nurse Note:  C/O chest pain x 3 days.

## 2020-03-25 NOTE — ED Provider Notes (Signed)
[facility] Emergency Department Provider Note   ____________________________________________    I have reviewed the triage vital signs and the nursing notes.   HISTORY  Chief Complaint Chest Pain     HPI Madison Carroll is a 54 y.o. female with a history as noted below who presents with complaints of migraine headache as well as left-sided chest discomfort.  Patient describes photophobia, global throbbing headache consistent with prior migraine headaches.  She reports this is been ongoing for 2 days.  She denies neuro deficits.  No head trauma or injury.  No focal weakness.  In addition she complains of left-sided chest discomfort which feels sore in nature.  No injury to the area.  No fevers or chills or cough.  Past Medical History:  Diagnosis Date  . Anxiety   . Bipolar 1 disorder (HCC)   . Mitral valve prolapse     There are no problems to display for this patient.   Past Surgical History:  Procedure Laterality Date  . ABDOMINAL HYSTERECTOMY      Prior to Admission medications   Medication Sig Start Date End Date Taking? Authorizing Provider  alprazolam Prudy Feeler) 2 MG tablet Take 2 mg by mouth 4 (four) times daily -  before meals and at bedtime. 06/29/18   [provider]  diclofenac sodium (VOLTAREN) 1 % GEL Apply 4 g topically 4 (four) times daily. 07/13/19   Orvil Feil, PA-C  doxepin (SINEQUAN) 25 MG capsule Take 25 mg by mouth at bedtime.    [provider]  gabapentin (NEURONTIN) 300 MG capsule Take 600 mg by mouth 3 (three) times daily.    [provider]  lamoTRIgine (LAMICTAL) 150 MG tablet Take 150 mg by mouth daily. 06/29/18   [provider]  meloxicam (MOBIC) 15 MG tablet Take 15 mg by mouth daily.    [provider]  predniSONE (DELTASONE) 50 MG tablet Take one tablet daily for the next five days. 07/13/19   Orvil Feil, PA-C  promethazine (PHENERGAN) 12.5 MG tablet Take 1  tablet (12.5 mg total) by mouth every 6 (six) hours as needed for nausea or vomiting. 03/25/20   Jene Every, MD     Allergies Metoclopramide, Codeine, Sulfa antibiotics, and Zofran [ondansetron hcl]  Family History  Problem Relation Age of Onset  . Breast cancer Mother 65    Social History Social History   Tobacco Use  . Smoking status: Current Every Day Smoker    Packs/day: 0.50    Types: Cigarettes  . Smokeless tobacco: Never Used  Vaping Use  . Vaping Use: Never used  Substance Use Topics  . Alcohol use: No  . Drug use: No    Review of Systems  Constitutional: No fever/chills  Eyes: No visual changes.  ENT: No sore throat. Cardiovascular: As above Respiratory: Denies shortness of breath. Gastrointestinal: No abdominal pain.  No nausea, no vomiting.   Genitourinary: Negative for dysuria. Musculoskeletal: Negative for back pain. Skin: Negative for rash. Neurological: As above   ____________________________________________   PHYSICAL EXAM:  VITAL SIGNS: ED Triage Vitals  Enc Vitals Group     BP 03/25/20 1509 117/74     Pulse Rate 03/25/20 1509 78     Resp 03/25/20 1509 18     Temp 03/25/20 1509 98.3 F (36.8 C)     Temp Source 03/25/20 1509 Oral     SpO2 03/25/20 1509 98 %     Weight 03/25/20 1509 73.5 kg (162  lb)     Height 03/25/20 1509 1.6 m (5\' 3" )     Head Circumference --      Peak Flow --      Pain Score 03/25/20 1528 8     Pain Loc --      Pain Edu? --      Excl. in GC? --     Constitutional: Alert and oriented.  Nose: No congestion/rhinnorhea. Mouth/Throat: Mucous membranes are moist.    Cardiovascular: Normal rate, regular rhythm. 05/26/20 peripheral circulation. Respiratory: Normal respiratory effort.  No retractions. Lungs CTAB. Gastrointestinal: Soft and nontender. No distention.  No CVA tenderness.  Musculoskeletal: No lower extremity tenderness nor edema.  Warm and well perfused Neurologic:  Normal speech and language. No  gross focal neurologic deficits are appreciated.  Skin:  Skin is warm, dry and intact. No rash noted. Psychiatric: Mood and affect are normal. Speech and behavior are normal.  ____________________________________________   LABS (all labs ordered are listed, but only abnormal results are displayed)  Labs Reviewed  BASIC METABOLIC PANEL - Abnormal; Notable for the following components:      Result Value   CO2 20 (*)    Calcium 8.5 (*)    All other components within normal limits  CBC - Abnormal; Notable for the following components:   RBC 3.77 (*)    Hemoglobin 11.6 (*)    HCT 35.1 (*)    All other components within normal limits  TROPONIN I (HIGH SENSITIVITY)   ____________________________________________  EKG  ED ECG REPORT I, Peri Jefferson, the attending physician, personally viewed and interpreted this ECG.  Date: 03/25/2020  Rhythm: normal sinus rhythm QRS Axis: normal Intervals: Abnormal ST/T Wave abnormalities: normal Narrative Interpretation: no evidence of acute ischemia  ____________________________________________  RADIOLOGY  CT angiography of the chest demonstrates pulmonary nodule, coronary artery disease but no pulmonary embolism, discussed the need for 6 to 59-month follow-up ____________________________________________   PROCEDURES  Procedure(s) performed: No  Procedures   Critical Care performed: No ____________________________________________   INITIAL IMPRESSION / ASSESSMENT AND PLAN / ED COURSE  Pertinent labs & imaging results that were available during my care of the patient were reviewed by me and considered in my medical decision making (see chart for details).  Patient presents with complaints as noted above.  Headache is consistent with migraine headaches that he she has had before.  We will treat with IV Toradol, IV Phenergan, IV Benadryl.  Patient is atypical chest pain, doubt ACS, pericarditis or myocarditis.  Chest wall pain  is a possibility as is PE. Her troponin is reassuring, EKG is reassuring.  CT angiography obtained which is negative for PE.  Offered patient admission however she declined and opted to go home with close follow-up with cardiology.  She knows she can return at any time if symptoms worsen.    ____________________________________________   FINAL CLINICAL IMPRESSION(S) / ED DIAGNOSES  Final diagnoses:  Migraine without status migrainosus, not intractable, unspecified migraine type  Atypical chest pain        Note:  This document was prepared using Dragon voice recognition software and may include unintentional dictation errors.   17-month, MD 03/25/20 2129

## 2020-03-25 NOTE — ED Notes (Signed)
Patient transported to CT 

## 2020-03-25 NOTE — ED Notes (Signed)
Informed MD of pt's continued pain  Awaiting orders

## 2020-03-29 ENCOUNTER — Emergency Department: Payer: Self-pay

## 2020-03-29 ENCOUNTER — Ambulatory Visit (INDEPENDENT_AMBULATORY_CARE_PROVIDER_SITE_OTHER): Payer: Self-pay | Admitting: Internal Medicine

## 2020-03-29 ENCOUNTER — Encounter: Payer: Self-pay | Admitting: Internal Medicine

## 2020-03-29 ENCOUNTER — Inpatient Hospital Stay
Admission: EM | Admit: 2020-03-29 | Discharge: 2020-04-01 | DRG: 287 | Disposition: A | Discharge status: Home / Self Care | Payer: Self-pay | Attending: Internal Medicine | Admitting: Internal Medicine

## 2020-03-29 ENCOUNTER — Other Ambulatory Visit: Payer: Self-pay

## 2020-03-29 VITALS — BP 120/84 | HR 69 | Ht 64.0 in | Wt 174.2 lb

## 2020-03-29 DIAGNOSIS — S0300XA Dislocation of jaw, unspecified side, initial encounter: Secondary | ICD-10-CM

## 2020-03-29 DIAGNOSIS — Z885 Allergy status to narcotic agent status: Secondary | ICD-10-CM

## 2020-03-29 DIAGNOSIS — F17210 Nicotine dependence, cigarettes, uncomplicated: Secondary | ICD-10-CM | POA: Diagnosis present

## 2020-03-29 DIAGNOSIS — X58XXXD Exposure to other specified factors, subsequent encounter: Secondary | ICD-10-CM | POA: Diagnosis present

## 2020-03-29 DIAGNOSIS — Z90710 Acquired absence of both cervix and uterus: Secondary | ICD-10-CM

## 2020-03-29 DIAGNOSIS — R911 Solitary pulmonary nodule: Secondary | ICD-10-CM | POA: Insufficient documentation

## 2020-03-29 DIAGNOSIS — Z79899 Other long term (current) drug therapy: Secondary | ICD-10-CM

## 2020-03-29 DIAGNOSIS — Z882 Allergy status to sulfonamides status: Secondary | ICD-10-CM

## 2020-03-29 DIAGNOSIS — Z9049 Acquired absence of other specified parts of digestive tract: Secondary | ICD-10-CM

## 2020-03-29 DIAGNOSIS — R079 Chest pain, unspecified: Secondary | ICD-10-CM | POA: Insufficient documentation

## 2020-03-29 DIAGNOSIS — F419 Anxiety disorder, unspecified: Secondary | ICD-10-CM | POA: Diagnosis present

## 2020-03-29 DIAGNOSIS — I200 Unstable angina: Secondary | ICD-10-CM | POA: Diagnosis present

## 2020-03-29 DIAGNOSIS — R001 Bradycardia, unspecified: Secondary | ICD-10-CM | POA: Diagnosis not present

## 2020-03-29 DIAGNOSIS — Z716 Tobacco abuse counseling: Secondary | ICD-10-CM

## 2020-03-29 DIAGNOSIS — Z720 Tobacco use: Secondary | ICD-10-CM | POA: Insufficient documentation

## 2020-03-29 DIAGNOSIS — G43909 Migraine, unspecified, not intractable, without status migrainosus: Secondary | ICD-10-CM | POA: Diagnosis present

## 2020-03-29 DIAGNOSIS — I25110 Atherosclerotic heart disease of native coronary artery with unstable angina pectoris: Principal | ICD-10-CM | POA: Diagnosis present

## 2020-03-29 DIAGNOSIS — E782 Mixed hyperlipidemia: Secondary | ICD-10-CM | POA: Diagnosis present

## 2020-03-29 DIAGNOSIS — Z20822 Contact with and (suspected) exposure to covid-19: Secondary | ICD-10-CM | POA: Diagnosis present

## 2020-03-29 DIAGNOSIS — Z888 Allergy status to other drugs, medicaments and biological substances status: Secondary | ICD-10-CM

## 2020-03-29 DIAGNOSIS — S0300XD Dislocation of jaw, unspecified side, subsequent encounter: Secondary | ICD-10-CM

## 2020-03-29 DIAGNOSIS — F319 Bipolar disorder, unspecified: Secondary | ICD-10-CM | POA: Diagnosis present

## 2020-03-29 DIAGNOSIS — Z8249 Family history of ischemic heart disease and other diseases of the circulatory system: Secondary | ICD-10-CM

## 2020-03-29 HISTORY — DX: Dislocation of jaw, unspecified side, initial encounter: S03.00XA

## 2020-03-29 LAB — CBC
HCT: 35 % — ABNORMAL LOW (ref 36.0–46.0)
Hemoglobin: 12.5 g/dL (ref 12.0–15.0)
MCH: 32.1 pg (ref 26.0–34.0)
MCHC: 35.7 g/dL (ref 30.0–36.0)
MCV: 90 fL (ref 80.0–100.0)
Platelets: 297 10*3/uL (ref 150–400)
RBC: 3.89 MIL/uL (ref 3.87–5.11)
RDW: 13.1 % (ref 11.5–15.5)
WBC: 10.7 10*3/uL — ABNORMAL HIGH (ref 4.0–10.5)
nRBC: 0 % (ref 0.0–0.2)

## 2020-03-29 LAB — BASIC METABOLIC PANEL
Anion gap: 9 (ref 5–15)
BUN: 11 mg/dL (ref 6–20)
CO2: 22 mmol/L (ref 22–32)
Calcium: 8.7 mg/dL — ABNORMAL LOW (ref 8.9–10.3)
Chloride: 105 mmol/L (ref 98–111)
Creatinine, Ser: 0.92 mg/dL (ref 0.44–1.00)
GFR calc Af Amer: >60 mL/min (ref >60)
GFR calc non Af Amer: >60 mL/min (ref >60)
Glucose, Bld: 99 mg/dL (ref 70–99)
Potassium: 4.1 mmol/L (ref 3.5–5.1)
Sodium: 136 mmol/L (ref 135–145)

## 2020-03-29 LAB — APTT: aPTT: 36 seconds (ref 24–36)

## 2020-03-29 LAB — SARS CORONAVIRUS 2 BY RT PCR (HOSPITAL ORDER, PERFORMED IN ~~LOC~~ HOSPITAL LAB): SARS Coronavirus 2: NEGATIVE

## 2020-03-29 LAB — PROTIME-INR
INR: 0.9 (ref 0.8–1.2)
Prothrombin Time: 11.8 seconds (ref 11.4–15.2)

## 2020-03-29 LAB — SEDIMENTATION RATE: Sed Rate: 22 mm/hr (ref 0–30)

## 2020-03-29 LAB — TROPONIN I (HIGH SENSITIVITY)
Troponin I (High Sensitivity): <2 ng/L (ref <18)
Troponin I (High Sensitivity): <2 ng/L (ref <18)

## 2020-03-29 MED ORDER — NICOTINE 14 MG/24HR TD PT24
14.0000 mg | MEDICATED_PATCH | Freq: Every day | TRANSDERMAL | Status: DC
  Administered 2020-03-29 – 2020-04-01 (×4): 14 mg via TRANSDERMAL
  Filled 2020-03-29 (×5): qty 1

## 2020-03-29 MED ORDER — HEPARIN BOLUS VIA INFUSION
3200.0000 [IU] | Freq: Once | INTRAVENOUS | Status: AC
  Administered 2020-03-29: 3200 [IU] via INTRAVENOUS
  Filled 2020-03-29: qty 3200

## 2020-03-29 MED ORDER — ASPIRIN 81 MG PO CHEW
324.0000 mg | CHEWABLE_TABLET | Freq: Once | ORAL | Status: AC
  Administered 2020-03-29: 324 mg via ORAL
  Filled 2020-03-29: qty 4

## 2020-03-29 MED ORDER — HEPARIN (PORCINE) 25000 UT/250ML-% IV SOLN
1200.0000 [IU]/h | INTRAVENOUS | Status: DC
  Administered 2020-03-29: 850 [IU]/h via INTRAVENOUS
  Administered 2020-03-30 – 2020-03-31 (×2): 1200 [IU]/h via INTRAVENOUS
  Filled 2020-03-29 (×4): qty 250

## 2020-03-29 MED ORDER — ASPIRIN EC 81 MG PO TBEC
81.0000 mg | DELAYED_RELEASE_TABLET | Freq: Every day | ORAL | Status: DC
  Administered 2020-03-30 – 2020-03-31 (×2): 81 mg via ORAL
  Filled 2020-03-29 (×2): qty 1

## 2020-03-29 MED ORDER — GABAPENTIN 600 MG PO TABS
600.0000 mg | ORAL_TABLET | Freq: Three times a day (TID) | ORAL | Status: DC
  Administered 2020-03-29 – 2020-04-01 (×8): 600 mg via ORAL
  Filled 2020-03-29 (×9): qty 1

## 2020-03-29 MED ORDER — KETOROLAC TROMETHAMINE 30 MG/ML IJ SOLN: 30.0000 mg | Freq: Once | INTRAMUSCULAR | Status: DC

## 2020-03-29 MED ORDER — METOPROLOL SUCCINATE ER 25 MG PO TB24
12.5000 mg | ORAL_TABLET | Freq: Every evening | ORAL | Status: DC
  Administered 2020-03-29 – 2020-03-31 (×3): 12.5 mg via ORAL
  Filled 2020-03-29 (×3): qty 1

## 2020-03-29 MED ORDER — TRAMADOL HCL 50 MG PO TABS
50.0000 mg | ORAL_TABLET | Freq: Once | ORAL | Status: AC
  Administered 2020-03-29: 50 mg via ORAL
  Filled 2020-03-29: qty 1

## 2020-03-29 MED ORDER — DICLOFENAC SODIUM 1 % TD GEL
4.0000 g | Freq: Four times a day (QID) | TRANSDERMAL | Status: DC
  Filled 2020-03-29: qty 100

## 2020-03-29 MED ORDER — DOXEPIN HCL 25 MG PO CAPS
25.0000 mg | ORAL_CAPSULE | Freq: Every day | ORAL | Status: DC
  Administered 2020-03-29 – 2020-03-31 (×3): 25 mg via ORAL
  Filled 2020-03-29 (×4): qty 1

## 2020-03-29 MED ORDER — SODIUM CHLORIDE 0.9% FLUSH
3.0000 mL | Freq: Once | INTRAVENOUS | Status: AC
  Administered 2020-03-31: 3 mL via INTRAVENOUS

## 2020-03-29 MED ORDER — ONDANSETRON HCL 4 MG/2ML IJ SOLN: 4.0000 mg | Freq: Four times a day (QID) | INTRAMUSCULAR | Status: DC | PRN

## 2020-03-29 MED ORDER — PROMETHAZINE HCL 25 MG PO TABS
12.5000 mg | ORAL_TABLET | Freq: Four times a day (QID) | ORAL | Status: DC | PRN
  Administered 2020-03-29 – 2020-04-01 (×4): 12.5 mg via ORAL
  Filled 2020-03-29 (×6): qty 1

## 2020-03-29 MED ORDER — ALPRAZOLAM 0.5 MG PO TABS
2.0000 mg | ORAL_TABLET | Freq: Three times a day (TID) | ORAL | Status: DC
  Administered 2020-03-29 – 2020-04-01 (×10): 2 mg via ORAL
  Filled 2020-03-29 (×10): qty 4

## 2020-03-29 MED ORDER — ACETAMINOPHEN 325 MG PO TABS
650.0000 mg | ORAL_TABLET | ORAL | Status: DC | PRN
  Filled 2020-03-29: qty 2

## 2020-03-29 MED ORDER — NITROGLYCERIN 2 % TD OINT
0.5000 [in_us] | TOPICAL_OINTMENT | Freq: Once | TRANSDERMAL | Status: AC
  Administered 2020-03-29: 0.5 [in_us] via TOPICAL
  Filled 2020-03-29: qty 1

## 2020-03-29 MED ORDER — LAMOTRIGINE 100 MG PO TABS
150.0000 mg | ORAL_TABLET | Freq: Every day | ORAL | Status: DC
  Administered 2020-03-29 – 2020-03-31 (×3): 150 mg via ORAL
  Filled 2020-03-29 (×3): qty 2

## 2020-03-29 MED ORDER — ACETAMINOPHEN 325 MG PO TABS
650.0000 mg | ORAL_TABLET | ORAL | Status: DC | PRN
  Administered 2020-03-30: 650 mg via ORAL
  Filled 2020-03-29: qty 2

## 2020-03-29 NOTE — Plan of Care (Signed)

## 2020-03-29 NOTE — Progress Notes (Signed)
ANTICOAGULATION CONSULT NOTE - Initial Consult  Pharmacy Consult for Heparin  Indication: chest pain/ACS  Allergies  Allergen Reactions  . Metoclopramide Hives and Other (See Comments)  . Codeine   . Sulfa Antibiotics   . Zofran [Ondansetron Hcl] Hives    Patient Measurements: Height: 5\' 4"  (162.6 cm) Weight: 79 kg (174 lb 4 oz) IBW/kg (Calculated) : 54.7 Heparin Dosing Weight: 71.6 kg   Vital Signs: Temp: 99 F (37.2 C) (07/16 1520) BP: 123/71 (07/16 1520) Pulse Rate: 66 (07/16 1520)  Labs: Recent Labs    03/29/20 1523  HGB 12.5  HCT 35.0*  PLT 297  CREATININE 0.92  TROPONINIHS <2    Estimated Creatinine Clearance: 71.1 mL/min (by C-G formula based on SCr of 0.92 mg/dL).   Medical History: Past Medical History:  Diagnosis Date  . Anxiety   . Bipolar 1 disorder (HCC)   . Mitral valve prolapse     Medications:  (Not in a hospital admission)   Assessment: Pharmacy consulted to dose heparin in this 54 year old female with ACS/NSTEMI.  CrCl = 71.1 ml/min No prior anticoag noted.   Goal of Therapy:  Heparin level 0.3-0.7 units/ml Monitor platelets by anticoagulation protocol: Yes   Plan:  Give 3200 units bolus x 1 Start heparin infusion at 850 units/hr Check anti-Xa level in 6 hours and daily while on heparin Continue to monitor H&H and platelets  Bryah Ocheltree D 03/29/2020,6:27 PM

## 2020-03-29 NOTE — ED Provider Notes (Signed)
ER Provider Note       Time seen: 5:57 PM    I have reviewed the vital signs and the nursing notes.  HISTORY   Chief Complaint Chest Pain    HPI Madison Carroll is a 54 y.o. female with a history of anxiety, bipolar disorder, mitral valve prolapse who presents today for left-sided chest pain that goes down the left arm and into the back.  Patient states this started about a week ago and is gotten worse.  Discomfort is 8 out of 10.  Reportedly she was seen by cardiology today who recommended admission and a heart catheterization.  Past Medical History:  Diagnosis Date  . Anxiety   . Bipolar 1 disorder (HCC)   . Mitral valve prolapse     Past Surgical History:  Procedure Laterality Date  . ABDOMINAL HYSTERECTOMY    . KNEE SURGERY Right   . LAPAROSCOPIC CHOLECYSTECTOMY      Allergies Metoclopramide, Codeine, Sulfa antibiotics, and Zofran [ondansetron hcl]   Review of Systems Constitutional: Negative for fever. Cardiovascular: Positive for chest pain Respiratory: Negative for shortness of breath. Gastrointestinal: Negative for abdominal pain, vomiting and diarrhea. Musculoskeletal: Positive for arm and back pain Skin: Negative for rash. Neurological: Negative for headaches, focal weakness or numbness.  All systems negative/normal/unremarkable except as stated in the HPI  ____________________________________________   PHYSICAL EXAM:  VITAL SIGNS: Vitals:   03/29/20 1520  BP: 123/71  Pulse: 66  Resp: 17  Temp: 99 F (37.2 C)  SpO2: 100%    Constitutional: Well appearing and in no distress. Eyes: Conjunctivae are normal. Normal extraocular movements. ENT      Head: Normocephalic and atraumatic.      Nose: No congestion/rhinnorhea.      Mouth/Throat: Mucous membranes are moist.      Neck: No stridor. Cardiovascular: Normal rate, regular rhythm. No murmurs, rubs, or gallops. Respiratory: Normal respiratory effort without tachypnea nor retractions. Breath  sounds are clear and equal bilaterally. No wheezes/rales/rhonchi. Gastrointestinal: Soft and nontender. Normal bowel sounds Musculoskeletal: Nontender with normal range of motion in extremities. No lower extremity tenderness nor edema. Neurologic:  Normal speech and language. No gross focal neurologic deficits are appreciated.  Skin:  Skin is warm, dry and intact. No rash noted. Psychiatric: Speech and behavior are normal.  ____________________________________________  EKG: Interpreted by me.  Sinus rhythm rate of 68 bpm, low voltage QRS, leftward axis, normal QT  ____________________________________________   LABS (pertinent positives/negatives)  Labs Reviewed  BASIC METABOLIC PANEL - Abnormal; Notable for the following components:      Result Value   Calcium 8.7 (*)    All other components within normal limits  CBC - Abnormal; Notable for the following components:   WBC 10.7 (*)    HCT 35.0 (*)    All other components within normal limits  TROPONIN I (HIGH SENSITIVITY)  TROPONIN I (HIGH SENSITIVITY)    RADIOLOGY  Images were viewed by me CXR IMPRESSION: No active cardiopulmonary disease.  DIFFERENTIAL DIAGNOSIS  Musculoskeletal pain, GERD, anxiety, PE, MI, dissection  ASSESSMENT AND PLAN  Chest pain, unstable angina   Plan: The patient had presented for chest pain. Patient's labs were reassuring.  I have reviewed the chart from cardiology who does recommend admission.  We will place her on heparin.  At this time she is medically stable.  Daryel November MD    Note: This note was generated in part or whole with voice recognition software. Voice recognition is usually quite accurate but  there are transcription errors that can and very often do occur. I apologize for any typographical errors that were not detected and corrected.     Emily Filbert, MD 03/29/20 1807

## 2020-03-29 NOTE — ED Triage Notes (Signed)
Pt comes via POV from [facility] with c/o left sided chest pain that goes to left arm and back. Pt states this started about a week ago and has gotten worse.  Pt states some nausea.

## 2020-03-29 NOTE — Patient Instructions (Signed)
To EMergency room for further evaluation.

## 2020-03-29 NOTE — ED Notes (Signed)
Unable to give report due to RN being unavailable.

## 2020-03-29 NOTE — Progress Notes (Signed)
New Outpatient Visit Date: 03/29/2020  Referring Provider: Jene Every, MD [facility] Emergency Department  Chief Complaint: Chest pain  HPI:  Madison Carroll is a 54 y.o. female who is being seen today for the evaluation of chest pain and shortness of breath at the request of Dr. Cyril Loosen. She has a history of mitral valve prolapse, anxiety, bipolar disorder, and tobacco use.  She presented to the [facility] emergency department 4 days ago complaining of chest pain, shortness of breath and headaches.  EKG was unrevealing and HS-TnI was negative x 1.  CTA of the chest was negative for PE, though extensive coronary artery calcification was noted.  Admission was recommended, which the patient refused.  Today, Madison Carroll reports that she has continued to have frequent chest pain.  This first began a week ago and seems to be worsened with any activity.  She developed substernal chest pressure radiating to the left arm with associated shortness of breath while vacuuming earlier today.  She continues to have 8/10 chest pressure.  There are no exacerbating or alleviating factors other than the morphine that she received during her recent ED visit.  She notes having undergone a stress test about 15 years ago for chest pain, though she does not recall if the pain was similar to what she is experiencing now.  [name] denies any recent medication changes or other factors to have precipitated the chest pain.  She reports a history of mitral valve prolapse diagnosed in her 20's.  She does not recall if an echocardiogram has ever been performed.  --------------------------------------------------------------------------------------------------  Cardiovascular History & Procedures: Cardiovascular Problems:  Chest pain  Risk Factors:  Tobacco use  Cath/PCI:  None  CV Surgery:  None  EP Procedures and Devices:  None  Non-Invasive Evaluation(s):  Dobutamine myocardial perfusion stress test  (07/10/2005): No evidence of ischemia or scar.  Recent CV Pertinent Labs: Lab Results  Component Value Date   K 4.0 03/25/2020   K 4.0 12/09/2014   BUN 10 03/25/2020   BUN 16 12/09/2014   CREATININE 0.77 03/25/2020   CREATININE 0.75 12/09/2014    --------------------------------------------------------------------------------------------------  Past Medical History:  Diagnosis Date  . Anxiety   . Bipolar 1 disorder (HCC)   . Mitral valve prolapse     Past Surgical History:  Procedure Laterality Date  . ABDOMINAL HYSTERECTOMY    . KNEE SURGERY Right   . LAPAROSCOPIC CHOLECYSTECTOMY      Current Meds  Medication Sig  . alprazolam (XANAX) 2 MG tablet Take 2 mg by mouth 4 (four) times daily -  before meals and at bedtime.  Marland Kitchen doxepin (SINEQUAN) 25 MG capsule Take 25 mg by mouth at bedtime.  . gabapentin (NEURONTIN) 300 MG capsule Take 600 mg by mouth 3 (three) times daily.  Marland Kitchen lamoTRIgine (LAMICTAL) 150 MG tablet Take 150 mg by mouth daily.  . promethazine (PHENERGAN) 12.5 MG tablet Take 1 tablet (12.5 mg total) by mouth every 6 (six) hours as needed for nausea or vomiting.    Allergies: Metoclopramide, Codeine, Sulfa antibiotics, and Zofran [ondansetron hcl]  Social History   Tobacco Use  . Smoking status: Current Every Day Smoker    Packs/day: 0.75    Types: Cigarettes  . Smokeless tobacco: Never Used  Vaping Use  . Vaping Use: Never used  Substance Use Topics  . Alcohol use: No  . Drug use: No    Family History  Problem Relation Age of Onset  . Breast cancer Mother 34  .  Coronary artery disease Mother   . Heart attack Father 52    Review of Systems: A 12-system review of systems was performed and was negative except as noted in the HPI.  --------------------------------------------------------------------------------------------------  Physical Exam: BP 120/84 (BP Location: Right Arm, Patient Position: Sitting, Cuff Size: Normal)   Pulse 69   Ht 5\' 4"   (1.626 m)   Wt 174 lb 4 oz (79 kg)   SpO2 97%   BMI 29.91 kg/m   General: NAD.  Accompanied by her mother. HEENT: No conjunctival pallor or scleral icterus. Facemask in place. Neck: Supple without lymphadenopathy, thyromegaly, JVD, or HJR. No carotid bruit. Lungs: Normal work of breathing. Clear to auscultation bilaterally without wheezes or crackles. Heart: Regular rate and rhythm without murmurs, rubs, or gallops. Non-displaced PMI. Abd: Bowel sounds present. Soft, NT/ND without hepatosplenomegaly Ext: No lower extremity edema. Radial, PT, and DP pulses are 2+ bilaterally Skin: Warm and dry without rash. Neuro: CNIII-XII intact. Strength and fine-touch sensation intact in upper and lower extremities bilaterally. Psych: Normal mood and affect.  EKG: Normal sinus rhythm with low voltage.  Otherwise, no significant abnormality.  Lab Results  Component Value Date   WBC 10.3 03/25/2020   HGB 11.6 (L) 03/25/2020   HCT 35.1 (L) 03/25/2020   MCV 93.1 03/25/2020   PLT 273 03/25/2020    Lab Results  Component Value Date   NA 136 03/25/2020   K 4.0 03/25/2020   CL 106 03/25/2020   CO2 20 (L) 03/25/2020   BUN 10 03/25/2020   CREATININE 0.77 03/25/2020   GLUCOSE 87 03/25/2020   ALT 16 07/16/2018    No results found for: CHOL, HDL, LDLCALC, LDLDIRECT, TRIG, CHOLHDL   --------------------------------------------------------------------------------------------------  ASSESSMENT AND PLAN: Unstable angina: Given recent onset of substernal chest pain rating to the left arm with associated shortness of breath that is worsened with activity but even present at rest, I am concerned about unstable angina and Ms. Finney.  Recent ED visit was notable for a single negative troponin.  CTA of the chest was notable for extensive coronary artery calcification.  Further inpatient evaluation was recommended, which Ms. 13/10/2017 refused.  Given continued symptoms, I have recommended that she proceed  back to the ER for admission.  We will give her aspirin 324 mg x 1 in the office today.  Given her risk factors (tobacco use, family history, and extensive coronary artery calcification) I suspect that we will need to consider cardiac catheterization on Monday.  In the meantime, serial troponins should be trended in the hospital.  I would have a low threshold for starting IV heparin as well.  Follow-up: To be determined based on hospital course.  Wednesday, MD 03/29/2020 3:32 PM

## 2020-03-29 NOTE — H&P (Signed)
[facility] [place] at [facility]   PATIENT NAME: Madison Carroll    MR#:  [id number]  DATE OF BIRTH:  Sep 02, 1966  DATE OF ADMISSION:  03/29/2020  PRIMARY CARE PHYSICIAN: Toy Cookey, FNP   REQUESTING/REFERRING PHYSICIAN: Dr Presley Raddle  CHIEF COMPLAINT:   Chief Complaint  Patient presents with  . Chest Pain    HISTORY OF PRESENT ILLNESS:  Madison Carroll  is a 54 y.o. female with a known history of bipolar disorder and anxiety TMJ and migraine.  She presents to the hospital with chest pain.  She came to the hospital on 03/29/2020 was having chest pain going on even before that.  She had a CT scan of the chest that was negative for pulmonary embolism at that time and sent home.  She followed up with Dr. Okey Dupre cardiology and was referred back to the emergency room for angina.  Patient was given aspirin in the cardiologist office.  Patient states the chest pain has been going on a few days prior to the 16th and has been constant in nature.  Sometimes up to a 9 out of 10 in intensity.  Her left arm is numb.  She is short of breath has periods of nausea vomiting.  Pain is mostly a pressure but then stabbing pains radiating to her back.  Nothing made the pain better or worse.  Today while she was vacuuming she got tired and had pain.  In the ER, first cardiac enzyme is negative.  Hospitalist services contacted for further evaluation.  Further testing based on cardiology opinion tomorrow.  Patient's father died the age of 66 of a heart attack  PAST MEDICAL HISTORY:   Past Medical History:  Diagnosis Date  . Anxiety   . Bipolar 1 disorder (HCC)   . Mitral valve prolapse   . TMJ (dislocation of temporomandibular joint)     PAST SURGICAL HISTORY:   Past Surgical History:  Procedure Laterality Date  . ABDOMINAL HYSTERECTOMY    . ABLATION ON ENDOMETRIOSIS    . KNEE SURGERY Right   . LAPAROSCOPIC CHOLECYSTECTOMY      SOCIAL HISTORY:   Social History   Tobacco  Use  . Smoking status: Current Every Day Smoker    Packs/day: 0.75    Types: Cigarettes  . Smokeless tobacco: Never Used  Substance Use Topics  . Alcohol use: No    FAMILY HISTORY:   Family History  Problem Relation Age of Onset  . Breast cancer Mother 24  . Coronary artery disease Mother   . Heart failure Mother   . Heart attack Father 73    DRUG ALLERGIES:   Allergies  Allergen Reactions  . Metoclopramide Hives and Other (See Comments)  . Codeine   . Sulfa Antibiotics   . Zofran [Ondansetron Hcl] Hives    REVIEW OF SYSTEMS:  CONSTITUTIONAL: No fever, positive chills and sweats.  Some fatigue.  EYES: Wears glasses.  Does not see well out of her left eye. EARS, NOSE, AND THROAT: Positive jaw pain RESPIRATORY: No cough.positive for shortness of breath, no wheezing or hemoptysis.  CARDIOVASCULAR: Positive for chest pain.  GASTROINTESTINAL: Positive for nausea, vomiting. no diarrhea or abdominal pain. No blood in bowel movements GENITOURINARY: No dysuria, hematuria.  ENDOCRINE: No polyuria, nocturia,  HEMATOLOGY: No anemia, easy bruising or bleeding SKIN: No rash or lesion. MUSCULOSKELETAL: Positive for joint pain NEUROLOGIC: No tingling, numbness, weakness.  PSYCHIATRY: No anxiety or depression.   MEDICATIONS AT HOME:   Prior to Admission  medications   Medication Sig Start Date End Date Taking? Authorizing Provider  alprazolam Prudy Feeler) 2 MG tablet Take 2 mg by mouth 4 (four) times daily -  before meals and at bedtime. 06/29/18  Yes [provider]  doxepin (SINEQUAN) 50 MG capsule TAKE 1 TO 2 CAPSULE(S) BY MOUTH EVERY NIGHT AT BEDTIME AS DIRECTED   Yes [provider]  gabapentin (NEURONTIN) 800 MG tablet Take by mouth. TAKE 1 TABLET BY MOUTH TWICE DAILY THEN TAKE 1 TABLET AT BEDTIME AS DIRECTED 03/21/20  Yes [provider]  lamoTRIgine (LAMICTAL) 150 MG tablet TAKE 2 TABLETS BY MOUTH ONCE DAILY 06/29/18  Yes [provider]   promethazine (PHENERGAN) 12.5 MG tablet Take 1 tablet (12.5 mg total) by mouth every 6 (six) hours as needed for nausea or vomiting. 03/25/20  Yes Jene Every, MD  diclofenac sodium (VOLTAREN) 1 % GEL Apply 4 g topically 4 (four) times daily. 07/13/19   Orvil Feil, PA-C   Medication reconciliation still undergoing.  VITAL SIGNS:  Blood pressure 123/71, pulse 66, temperature 99 F (37.2 C), resp. rate 17, height 5\' 4"  (1.626 m), weight 79 kg, SpO2 100 %.  PHYSICAL EXAMINATION:  GENERAL:  54 y.o.-year-old patient lying in the bed with no acute distress.  EYES: Pupils equal, round, reactive to light and accommodation. No scleral icterus.  Slight photophobia HEENT: Head atraumatic, normocephalic. Oropharynx and nasopharynx clear.  NECK:  Supple, no jugular venous distention. No thyroid enlargement, no tenderness.  LUNGS: Normal breath sounds bilaterally, no wheezing, rales,rhonchi or crepitation. No use of accessory muscles of respiration.  CARDIOVASCULAR: S1, S2 normal. No murmurs, rubs, or gallops.  Slight reproducible pain left lower ribs. ABDOMEN: Soft, nontender, nondistended. Bowel sounds present. No organomegaly or mass.  EXTREMITIES: No pedal edema, cyanosis, or clubbing.  NEUROLOGIC: Cranial nerves II through XII are intact. Muscle strength 5/5 in all extremities. Sensation intact. Gait not checked.  PSYCHIATRIC: The patient is alert and oriented x 3.  SKIN: No rash, lesion, or ulcer.   LABORATORY PANEL:   CBC Recent Labs  Lab 03/29/20 1523  WBC 10.7*  HGB 12.5  HCT 35.0*  PLT 297   ------------------------------------------------------------------------------------------------------------------  Chemistries  Recent Labs  Lab 03/29/20 1523  NA 136  K 4.1  CL 105  CO2 22  GLUCOSE 99  BUN 11  CREATININE 0.92  CALCIUM 8.7*   ------------------------------------------------------------------------------------------------------------------  Cardiac  Enzymes First high-sensitivity troponin less than 2.  RADIOLOGY:  DG Chest 2 View  Result Date: 03/29/2020 CLINICAL DATA:  Chest pain EXAM: CHEST - 2 VIEW COMPARISON:  CT and chest radiograph 03/25/2020 FINDINGS: Lungs are clear. No pneumothorax or pleural effusion. Rounded opacity within the right infrahilar region likely represents vascular shadow based on recent CT examination. The cardiomediastinal silhouette is unremarkable. Pulmonary vascularity normal. Osseous structures are age-appropriate. IMPRESSION: No active cardiopulmonary disease. Electronically Signed   By: 05/26/2020 MD   On: 03/29/2020 16:24    EKG:   Normal sinus rhythm 60 bpm, low voltage.  IMPRESSION AND PLAN:   1.  Chest pain constant going on for good period of time with first enzyme being negative.  Patient has a positive family history.  We will check a second cardiac enzyme if that is negative can consider stress test in the morning.  Echocardiogram ordered.  Since the patient was sent in by Dr. 03/31/2020 cardiology will get cardiology consultation in the morning.  Aspirin and heparin drip ordered by ER physician.  Will start low-dose  Toprol. 2.  Anxiety and bipolar disorder.  Continue psychiatric medications 3.  TMJ.  Continue gabapentin.  Check a sedimentation rate 4.  Pulmonary nodule seen on CT scan of the chest recently.  Will need to follow-up in 6 to 12 months 5.  Tobacco abuse nicotine patch ordered 6.  Will need to see ophthalmology as outpatient for worsening vision.  Send off a sedimentation rate.  All the records are reviewed and case discussed with ED provider. Management plans discussed with the patient, and she is in agreement.  I tried calling the patient's mother on the phone number but the phone just kept on ringing.  CODE STATUS: Full code  TOTAL TIME TAKING CARE OF THIS PATIENT: 50 minutes.    Alford Highland M.D on 03/29/2020 at 7:11 PM  Between 7am to 6pm - Pager - [phone]  After 6pm  call admission pager [phone]  [facility]  CC: Primary care physician; Toy Cookey, FNP

## 2020-03-30 ENCOUNTER — Observation Stay (HOSPITAL_COMMUNITY)
Admit: 2020-03-30 | Discharge: 2020-03-30 | Disposition: A | Discharge status: Home / Self Care | Payer: Self-pay | Attending: Internal Medicine | Admitting: Internal Medicine

## 2020-03-30 DIAGNOSIS — I361 Nonrheumatic tricuspid (valve) insufficiency: Secondary | ICD-10-CM

## 2020-03-30 DIAGNOSIS — I340 Nonrheumatic mitral (valve) insufficiency: Secondary | ICD-10-CM

## 2020-03-30 DIAGNOSIS — E782 Mixed hyperlipidemia: Secondary | ICD-10-CM

## 2020-03-30 DIAGNOSIS — I200 Unstable angina: Secondary | ICD-10-CM

## 2020-03-30 DIAGNOSIS — Z720 Tobacco use: Secondary | ICD-10-CM

## 2020-03-30 DIAGNOSIS — I351 Nonrheumatic aortic (valve) insufficiency: Secondary | ICD-10-CM

## 2020-03-30 LAB — ECHOCARDIOGRAM COMPLETE
AR max vel: 1.75 cm2
AV Area VTI: 1.75 cm2
AV Area mean vel: 1.8 cm2
AV Mean grad: 6 mmHg
AV Peak grad: 12.3 mmHg
Ao pk vel: 1.75 m/s
Area-P 1/2: 3.12 cm2
Height: 63 in
S' Lateral: 3.81 cm
Weight: 2753.6 oz

## 2020-03-30 LAB — BASIC METABOLIC PANEL
Anion gap: 8 (ref 5–15)
BUN: 10 mg/dL (ref 6–20)
CO2: 25 mmol/L (ref 22–32)
Calcium: 8.9 mg/dL (ref 8.9–10.3)
Chloride: 106 mmol/L (ref 98–111)
Creatinine, Ser: 0.92 mg/dL (ref 0.44–1.00)
GFR calc Af Amer: >60 mL/min (ref >60)
GFR calc non Af Amer: >60 mL/min (ref >60)
Glucose, Bld: 96 mg/dL (ref 70–99)
Potassium: 3.6 mmol/L (ref 3.5–5.1)
Sodium: 139 mmol/L (ref 135–145)

## 2020-03-30 LAB — HEPARIN LEVEL (UNFRACTIONATED)
Heparin Unfractionated: 0.17 IU/mL — ABNORMAL LOW (ref 0.30–0.70)
Heparin Unfractionated: 0.16 IU/mL — ABNORMAL LOW (ref 0.30–0.70)
Heparin Unfractionated: 0.58 IU/mL (ref 0.30–0.70)

## 2020-03-30 LAB — CBC
HCT: 34.7 % — ABNORMAL LOW (ref 36.0–46.0)
Hemoglobin: 11.5 g/dL — ABNORMAL LOW (ref 12.0–15.0)
MCH: 31 pg (ref 26.0–34.0)
MCHC: 33.1 g/dL (ref 30.0–36.0)
MCV: 93.5 fL (ref 80.0–100.0)
Platelets: 283 10*3/uL (ref 150–400)
RBC: 3.71 MIL/uL — ABNORMAL LOW (ref 3.87–5.11)
RDW: 12.9 % (ref 11.5–15.5)
WBC: 11 10*3/uL — ABNORMAL HIGH (ref 4.0–10.5)
nRBC: 0 % (ref 0.0–0.2)

## 2020-03-30 LAB — LIPID PANEL
Cholesterol: 211 mg/dL — ABNORMAL HIGH (ref 0–200)
HDL: 51 mg/dL (ref >40)
LDL Cholesterol: 135 mg/dL — ABNORMAL HIGH (ref 0–99)
Total CHOL/HDL Ratio: 4.1 RATIO
Triglycerides: 124 mg/dL (ref <150)
VLDL: 25 mg/dL (ref 0–40)

## 2020-03-30 LAB — HIV ANTIBODY (ROUTINE TESTING W REFLEX): HIV Screen 4th Generation wRfx: NONREACTIVE

## 2020-03-30 MED ORDER — HEPARIN BOLUS VIA INFUSION
1500.0000 [IU] | Freq: Once | INTRAVENOUS | Status: AC
  Administered 2020-03-30: 1500 [IU] via INTRAVENOUS
  Filled 2020-03-30: qty 1500

## 2020-03-30 MED ORDER — PROMETHAZINE HCL 25 MG/ML IJ SOLN
25.0000 mg | Freq: Once | INTRAMUSCULAR | Status: AC
  Administered 2020-03-30: 25 mg via INTRAVENOUS
  Filled 2020-03-30: qty 1

## 2020-03-30 MED ORDER — METHOCARBAMOL 500 MG PO TABS
500.0000 mg | ORAL_TABLET | Freq: Once | ORAL | Status: AC
  Administered 2020-03-30: 500 mg via ORAL
  Filled 2020-03-30: qty 1

## 2020-03-30 MED ORDER — BUTALBITAL-APAP-CAFFEINE 50-325-40 MG PO TABS
1.0000 | ORAL_TABLET | ORAL | Status: DC | PRN
  Administered 2020-03-30 – 2020-03-31 (×2): 1 via ORAL
  Filled 2020-03-30 (×2): qty 1

## 2020-03-30 MED ORDER — ATORVASTATIN CALCIUM 20 MG PO TABS
40.0000 mg | ORAL_TABLET | Freq: Every day | ORAL | Status: DC
  Administered 2020-03-30 – 2020-04-01 (×3): 40 mg via ORAL
  Filled 2020-03-30 (×3): qty 2

## 2020-03-30 MED ORDER — MORPHINE SULFATE (PF) 2 MG/ML IV SOLN
2.0000 mg | Freq: Once | INTRAVENOUS | Status: AC
  Administered 2020-03-30: 2 mg via INTRAVENOUS
  Filled 2020-03-30: qty 1

## 2020-03-30 MED ORDER — PANTOPRAZOLE SODIUM 40 MG PO TBEC
40.0000 mg | DELAYED_RELEASE_TABLET | Freq: Every day | ORAL | Status: DC
  Administered 2020-03-30 – 2020-04-01 (×3): 40 mg via ORAL
  Filled 2020-03-30 (×3): qty 1

## 2020-03-30 NOTE — Progress Notes (Signed)
Pt stated that toradol does not work for her and asked for Morphine.  NP notified.  NP ordered Ultram.  Pt made aware, she stated, " that's even worse".  Pt did say she would try it.

## 2020-03-30 NOTE — Progress Notes (Signed)
ANTICOAGULATION CONSULT NOTE  Pharmacy Consult for Heparin  Indication: chest pain/ACS  Allergies  Allergen Reactions  . Metoclopramide Hives and Other (See Comments)  . Codeine   . Sulfa Antibiotics   . Zofran [Ondansetron Hcl] Hives    Patient Measurements: Height: 5\' 3"  (160 cm) Weight: 78.1 kg (172 lb 1.6 oz) IBW/kg (Calculated) : 52.4 Heparin Dosing Weight: 71.6 kg   Vital Signs: Temp: 98.2 F (36.8 C) (07/17 0936) BP: 123/82 (07/17 0936) Pulse Rate: 56 (07/17 0936)  Labs: Recent Labs    03/29/20 1523 03/29/20 1904 03/30/20 0059 03/30/20 0746  HGB 12.5  --  11.5*  --   HCT 35.0*  --  34.7*  --   PLT 297  --  283  --   APTT  --  36  --   --   LABPROT  --  11.8  --   --   INR  --  0.9  --   --   HEPARINUNFRC  --   --  0.17* 0.16*  CREATININE 0.92  --  0.92  --   TROPONINIHS <2 <2  --   --     Estimated Creatinine Clearance: 69.2 mL/min (by C-G formula based on SCr of 0.92 mg/dL).   Medical History: Past Medical History:  Diagnosis Date  . Anxiety   . Bipolar 1 disorder (HCC)   . Mitral valve prolapse   . TMJ (dislocation of temporomandibular joint)      Assessment: Pharmacy consulted to dose heparin in this 54 year old female with ACS/NSTEMI.  CrCl = 71.1 ml/min No prior anticoag noted.   Goal of Therapy:  Heparin level 0.3-0.7 units/ml Monitor platelets by anticoagulation protocol: Yes   Plan:  7/17 0746 HL 0.16 subtherapeutic. Heparin 1500 unit bolus followed by increase in heparin drip to 1200 units/hr. HL at 1800. CBC daily.  8/17, PharmD Clinical Pharmacist 03/30/2020,10:49 AM

## 2020-03-30 NOTE — Progress Notes (Signed)
Pt received to room 255 from ED.  Pt still c/o chest pain 9/10 unchanged since before coming to ED.  Pt oriented to room and call bell.  Pt asked for something to eat and provided sandwich tray and gingerale.  Pt sitting up in bed answering all admit questions and watching tv.  SB on the monitor.

## 2020-03-30 NOTE — Progress Notes (Signed)
ANTICOAGULATION CONSULT NOTE  Pharmacy Consult for Heparin  Indication: chest pain/ACS  Allergies  Allergen Reactions  . Metoclopramide Hives and Other (See Comments)  . Codeine   . Sulfa Antibiotics   . Zofran [Ondansetron Hcl] Hives    Patient Measurements: Height: 5\' 3"  (160 cm) Weight: 78.1 kg (172 lb 1.6 oz) IBW/kg (Calculated) : 52.4 Heparin Dosing Weight: 71.6 kg   Vital Signs: Temp: 97.8 F (36.6 C) (07/17 1525) Temp Source: Oral (07/17 1139) BP: 116/81 (07/17 1525) Pulse Rate: 60 (07/17 1525)  Labs: Recent Labs    03/29/20 1523 03/29/20 1904 03/30/20 0059 03/30/20 0746 03/30/20 1757  HGB 12.5  --  11.5*  --   --   HCT 35.0*  --  34.7*  --   --   PLT 297  --  283  --   --   APTT  --  36  --   --   --   LABPROT  --  11.8  --   --   --   INR  --  0.9  --   --   --   HEPARINUNFRC  --   --  0.17* 0.16* 0.58  CREATININE 0.92  --  0.92  --   --   TROPONINIHS <2 <2  --   --   --     Estimated Creatinine Clearance: 69.2 mL/min (by C-G formula based on SCr of 0.92 mg/dL).   Medical History: Past Medical History:  Diagnosis Date  . Anxiety   . Bipolar 1 disorder (HCC)   . Mitral valve prolapse   . TMJ (dislocation of temporomandibular joint)      Assessment: Pharmacy consulted to dose heparin in this 54 year old female with ACS/NSTEMI.  CrCl = 71.1 ml/min No prior anticoag noted.   Goal of Therapy:  Heparin level 0.3-0.7 units/ml Monitor platelets by anticoagulation protocol: Yes   Plan:  7/17 1757 HL 0.58 therapeutic x 1. Continue heparin drip at 1200 units/hr. HL at 0000 to confirm. CBC daily.  8/17, PharmD, BCPS 03/30/2020 6:45 PM

## 2020-03-30 NOTE — Progress Notes (Signed)
ANTICOAGULATION CONSULT NOTE - Initial Consult  Pharmacy Consult for Heparin  Indication: chest pain/ACS  Allergies  Allergen Reactions  . Metoclopramide Hives and Other (See Comments)  . Codeine   . Sulfa Antibiotics   . Zofran [Ondansetron Hcl] Hives    Patient Measurements: Height: 5\' 3"  (160 cm) Weight: 78.4 kg (172 lb 12.8 oz) IBW/kg (Calculated) : 52.4 Heparin Dosing Weight: 71.6 kg   Vital Signs: Temp: 97.9 F (36.6 C) (07/16 2043) Temp Source: Oral (07/16 2043) BP: 150/91 (07/16 2043) Pulse Rate: 49 (07/16 2043)  Labs: Recent Labs    03/29/20 1523 03/29/20 1904 03/30/20 0059  HGB 12.5  --  11.5*  HCT 35.0*  --  34.7*  PLT 297  --  283  APTT  --  36  --   LABPROT  --  11.8  --   INR  --  0.9  --   HEPARINUNFRC  --   --  0.17*  CREATININE 0.92  --   --   TROPONINIHS <2 <2  --     Estimated Creatinine Clearance: 69.3 mL/min (by C-G formula based on SCr of 0.92 mg/dL).   Medical History: Past Medical History:  Diagnosis Date  . Anxiety   . Bipolar 1 disorder (HCC)   . Mitral valve prolapse   . TMJ (dislocation of temporomandibular joint)     Medications:  Medications Prior to Admission  Medication Sig Dispense Refill Last Dose  . alprazolam (XANAX) 2 MG tablet Take 2 mg by mouth 4 (four) times daily -  before meals and at bedtime.  2 03/28/2020 at 2100  . doxepin (SINEQUAN) 50 MG capsule TAKE 1 TO 2 CAPSULE(S) BY MOUTH EVERY NIGHT AT BEDTIME AS DIRECTED   03/28/2020 at 2100  . gabapentin (NEURONTIN) 800 MG tablet Take by mouth. TAKE 1 TABLET BY MOUTH TWICE DAILY THEN TAKE 1 TABLET AT BEDTIME AS DIRECTED   03/28/2020 at 2100  . lamoTRIgine (LAMICTAL) 150 MG tablet TAKE 2 TABLETS BY MOUTH ONCE DAILY  4 03/28/2020 at 2100  . promethazine (PHENERGAN) 12.5 MG tablet Take 1 tablet (12.5 mg total) by mouth every 6 (six) hours as needed for nausea or vomiting. 30 tablet 0 Past Week at PRN    Assessment: Pharmacy consulted to dose heparin in this 54 year old  female with ACS/NSTEMI.  CrCl = 71.1 ml/min No prior anticoag noted.   Goal of Therapy:  Heparin level 0.3-0.7 units/ml Monitor platelets by anticoagulation protocol: Yes   Plan:  07/17 @ 0100 HL 0.17 subtherapeutic. Will increase rate to 1000 units/hr and will recheck HL at 0800, CBC stable will continue to monitor.  8/17, PharmD, BCPS Clinical Pharmacist 03/30/2020,2:02 AM

## 2020-03-30 NOTE — Progress Notes (Signed)
Progress Note  Patient Name: Madison Carroll Date of Encounter: 03/30/2020  [facility] Cardiologist: No primary care provider on file.   Subjective   Patient complaining of "migraine headache."  Continues to have some chest discomfort as well.  No shortness of breath or heart palpitations.  No orthopnea or PND.  Inpatient Medications    Scheduled Meds:  alprazolam  2 mg Oral TID AC & HS   aspirin EC  81 mg Oral Daily   diclofenac sodium  4 g Topical QID   doxepin  25 mg Oral QHS   gabapentin  600 mg Oral TID   lamoTRIgine  150 mg Oral Daily   metoprolol succinate  12.5 mg Oral QPM   nicotine  14 mg Transdermal Daily   sodium chloride flush  3 mL Intravenous Once   Continuous Infusions:  heparin 1,000 Units/hr (03/30/20 0208)   PRN Meds: acetaminophen, acetaminophen, butalbital-acetaminophen-caffeine, ondansetron (ZOFRAN) IV, promethazine   Vital Signs    Vitals:   03/29/20 2043 03/30/20 0500 03/30/20 0526 03/30/20 0936  BP: (!) 150/91  109/70 123/82  Pulse: (!) 49  (!) 52 (!) 56  Resp:    17  Temp: 97.9 F (36.6 C)  97.6 F (36.4 C) 98.2 F (36.8 C)  TempSrc: Oral     SpO2: 100%  100% 98%  Weight: 78.4 kg 78.1 kg    Height: 5\' 3"  (1.6 m)       Intake/Output Summary (Last 24 hours) at 03/30/2020 1019 Last data filed at 03/30/2020 0500 Gross per 24 hour  Intake 346.03 ml  Output 0 ml  Net 346.03 ml   Last 3 Weights 03/30/2020 03/29/2020 03/29/2020  Weight (lbs) 172 lb 1.6 oz 172 lb 12.8 oz 174 lb 4 oz  Weight (kg) 78.064 kg 78.382 kg 79.039 kg      Telemetry    Sinus rhythm - Personally Reviewed  ECG    Normal sinus rhythm 68 bpm, baseline artifact, otherwise within normal limits - Personally Reviewed  Physical Exam  Alert, oriented, in no distress GEN: No acute distress.   Neck: No JVD Cardiac: RRR, no murmurs, rubs, or gallops.  Respiratory: Clear to auscultation bilaterally. GI: Soft, nontender, non-distended  MS: No edema; No  deformity. Neuro:  Nonfocal  Psych: Normal affect   Labs    High Sensitivity Troponin:   Recent Labs  Lab 03/25/20 1510 03/29/20 1523 03/29/20 1904  TROPONINIHS 3 <2 <2      Chemistry Recent Labs  Lab 03/25/20 1510 03/29/20 1523 03/30/20 0059  NA 136 136 139  K 4.0 4.1 3.6  CL 106 105 106  CO2 20* 22 25  GLUCOSE 87 99 96  BUN 10 11 10   CREATININE 0.77 0.92 0.92  CALCIUM 8.5* 8.7* 8.9  GFRNONAA >60 >60 >60  GFRAA >60 >60 >60  ANIONGAP 10 9 8      Hematology Recent Labs  Lab 03/25/20 1510 03/29/20 1523 03/30/20 0059  WBC 10.3 10.7* 11.0*  RBC 3.77* 3.89 3.71*  HGB 11.6* 12.5 11.5*  HCT 35.1* 35.0* 34.7*  MCV 93.1 90.0 93.5  MCH 30.8 32.1 31.0  MCHC 33.0 35.7 33.1  RDW 12.9 13.1 12.9  PLT 273 297 283    BNPNo results for input(s): BNP, PROBNP in the last 168 hours.   DDimer No results for input(s): DDIMER in the last 168 hours.   Radiology    DG Chest 2 View  Result Date: 03/29/2020 CLINICAL DATA:  Chest pain EXAM: CHEST - 2  VIEW COMPARISON:  CT and chest radiograph 03/25/2020 FINDINGS: Lungs are clear. No pneumothorax or pleural effusion. Rounded opacity within the right infrahilar region likely represents vascular shadow based on recent CT examination. The cardiomediastinal silhouette is unremarkable. Pulmonary vascularity normal. Osseous structures are age-appropriate. IMPRESSION: No active cardiopulmonary disease. Electronically Signed   By: Helyn Numbers MD   On: 03/29/2020 16:24    Cardiac Studies   2D echo pending  Patient Profile     54 y.o. female with history of mitral valve prolapse and tobacco use, hospitalized yesterday with symptoms of unstable angina  Assessment & Plan    1.  Unstable angina pectoris: Cardiac biomarkers and EKG are within normal limits.  CTA of the chest demonstrated extensive coronary artery calcification.  Patient has a strong family history of CAD and she is a smoker.  Considering her high risk profile, I agree  that definitive evaluation with cardiac catheterization is indicated.  She also has had multiple chest pain evaluations recently.  I have personally reviewed the risks, indications, and alternatives to cardiac catheterization and possible PCI with the patient.  She has a strong right radial pulse and would be appropriate for radial access. Risks include but are not limited to bleeding, infection, vascular injury, stroke, myocardial infection, arrhythmia, kidney injury, radiation-related injury in the case of prolonged fluoroscopy use, emergency cardiac surgery, and death. The patient understands the risks of serious complication is 1-2 in 1000 with diagnostic cardiac cath and 1-2% or less with angioplasty/stenting.  Would continue aspirin, IV heparin, and metoprolol succinate at current doses.  Avoid nitroglycerin products because of her headache.  Plan to proceed with cardiac catheterization Monday unless unstable symptoms arise. 2.  Tobacco abuse: Cessation counseling done 3.  Mixed hyperlipidemia: LDL cholesterol 135.  Start high intensity statin drug. 4.  Headache: We will let the primary team know she is requesting analgesics for her headache.     For questions or updates, please contact [facility] Please consult [email or web address] for contact info under        Signed, Tonny Bollman, MD  03/30/2020, 10:19 AM

## 2020-03-30 NOTE — Progress Notes (Signed)
PROGRESS NOTE    Madison Carroll  EZM:[id number] DOB: 12-May-1966 DOA: 03/29/2020 PCP: Toy Cookey, FNP    Brief Narrative:  Madison Carroll  is a 54 y.o. female with a known history of bipolar disorder and anxiety TMJ and migraine.  She presents to the hospital with chest pain.  She came to the hospital on 03/29/2020 was having chest pain going on even before that.  She had a CT scan of the chest that was negative for pulmonary embolism at that time and sent home.  She followed up with Dr. Okey Dupre cardiology and was referred back to the emergency room for angina.  Patient was given aspirin in the cardiologist office.  Patient states the chest pain has been going on a few days prior to the 16th and has been constant in nature.  Sometimes up to a 9 out of 10 in intensity.  Her left arm is numb.  She is short of breath has periods of nausea vomiting.  Pain is mostly a pressure but then stabbing pains radiating to her back.  Nothing made the pain better or worse.  Today while she was vacuuming she got tired and had pain.  In the ER, first cardiac enzyme is negative.  Hospitalist services contacted for further evaluation.  Further testing based on cardiology opinion tomorrow.  Patient's father died the age of 45 of a heart attack  7/17: Patient seen and examined.  No chest pain at time of evaluation.  Does endorse migraine headache.  Case discussed with cardiology.  Given the patient's risk factors current plan of care is to proceed directly to cardiac catheterization.  This is tentatively planned for Monday, 04/01/2020.   Assessment & Plan:   Active Problems:   Unstable angina (HCC)  Unstable angina Patient had chest pain of unclear etiology.  Has been progressive for a long period of time.  No significant family history.  Patient was seen by Dr. Excell Seltzer from [facility] cardiology.  Previously scheduled stress test canceled.  Plan to proceed with cardiac catheterization on Monday, 04/01/2020.  We will continue  aspirin and heparin drip until my point.  Continue beta-blocker.  Anxiety and bipolar disorder Continue home psychiatric medications  TMJ Continue home gabapentin  Pulmonary nodule Incidental finding on recent chest CT Follow-up in 6 to 12 months  Tobacco abuse Nicotine patch ordered  Migraine Headache Morphine and phenergan x 1 PRN fioricet   DVT prophylaxis: Heparin GTT Code Status: Full Family Communication: None today Disposition Plan: Status is: Observation  The patient will require care spanning > 2 midnights and should be moved to inpatient because: Inpatient level of care appropriate due to severity of illness  Dispo: The patient is from: Home              Anticipated d/c is to: Home              Anticipated d/c date is: 3 days              Patient currently is not medically stable to d/c.   Unstable angina on heparin GTT.  Plan for cardiac catheterization Monday, 04/01/2020      Consultants:   Cardiology-[facility]  Procedures:   None  Antimicrobials:   None   Subjective: Seen and examined.  Complains of migraine.  Objective: Vitals:   03/30/20 0500 03/30/20 0526 03/30/20 0936 03/30/20 1139  BP:  109/70 123/82 (!) 160/97  Pulse:  (!) 52 (!) 56 63  Resp:   17 19  Temp:  97.6 F (36.4 C) 98.2 F (36.8 C) 98.5 F (36.9 C)  TempSrc:    Oral  SpO2:  100% 98% 100%  Weight: 78.1 kg     Height:        Intake/Output Summary (Last 24 hours) at 03/30/2020 1339 Last data filed at 03/30/2020 1000 Gross per 24 hour  Intake 466.03 ml  Output 0 ml  Net 466.03 ml   Filed Weights   03/29/20 1521 03/29/20 2043 03/30/20 0500  Weight: 79 kg 78.4 kg 78.1 kg    Examination:  General exam: Appears calm and comfortable  Respiratory system: Clear to auscultation. Respiratory effort normal. Cardiovascular system: S1 & S2 heard, RRR. No JVD, murmurs, rubs, gallops or clicks. No pedal edema. Gastrointestinal system: Abdomen is nondistended, soft and  nontender. No organomegaly or masses felt. Normal bowel sounds heard. Central nervous system: Alert and oriented. No focal neurological deficits. Extremities: Symmetric 5 x 5 power. Skin: No rashes, lesions or ulcers Psychiatry: Judgement and insight appear normal. Mood & affect appropriate.     Data Reviewed: I have personally reviewed following labs and imaging studies  CBC: Recent Labs  Lab 03/25/20 1510 03/29/20 1523 03/30/20 0059  WBC 10.3 10.7* 11.0*  HGB 11.6* 12.5 11.5*  HCT 35.1* 35.0* 34.7*  MCV 93.1 90.0 93.5  PLT 273 297 283   Basic Metabolic Panel: Recent Labs  Lab 03/25/20 1510 03/29/20 1523 03/30/20 0059  NA 136 136 139  K 4.0 4.1 3.6  CL 106 105 106  CO2 20* 22 25  GLUCOSE 87 99 96  BUN 10 11 10   CREATININE 0.77 0.92 0.92  CALCIUM 8.5* 8.7* 8.9   GFR: Estimated Creatinine Clearance: 69.2 mL/min (by C-G formula based on SCr of 0.92 mg/dL). Liver Function Tests: No results for input(s): AST, ALT, ALKPHOS, BILITOT, PROT, ALBUMIN in the last 168 hours. No results for input(s): LIPASE, AMYLASE in the last 168 hours. No results for input(s): AMMONIA in the last 168 hours. Coagulation Profile: Recent Labs  Lab 03/29/20 1904  INR 0.9   Cardiac Enzymes: No results for input(s): CKTOTAL, CKMB, CKMBINDEX, TROPONINI in the last 168 hours. BNP (last 3 results) No results for input(s): PROBNP in the last 8760 hours. HbA1C: No results for input(s): HGBA1C in the last 72 hours. CBG: No results for input(s): GLUCAP in the last 168 hours. Lipid Profile: Recent Labs    03/30/20 0059  CHOL 211*  HDL 51  LDLCALC 135*  TRIG 124  CHOLHDL 4.1   Thyroid Function Tests: No results for input(s): TSH, T4TOTAL, FREET4, T3FREE, THYROIDAB in the last 72 hours. Anemia Panel: No results for input(s): VITAMINB12, FOLATE, FERRITIN, TIBC, IRON, RETICCTPCT in the last 72 hours. Sepsis Labs: No results for input(s): PROCALCITON, LATICACIDVEN in the last 168  hours.  Recent Results (from the past 240 hour(s))  SARS Coronavirus 2 by RT PCR (hospital order, performed in [facility] hospital lab) Nasopharyngeal Nasopharyngeal Swab     Status: None   Collection Time: 03/29/20  7:29 PM   Specimen: Nasopharyngeal Swab  Result Value Ref Range Status   SARS Coronavirus 2 NEGATIVE NEGATIVE Final    Comment: (NOTE) SARS-CoV-2 target nucleic acids are NOT DETECTED.  The SARS-CoV-2 RNA is generally detectable in upper and lower respiratory specimens during the acute phase of infection. The lowest concentration of SARS-CoV-2 viral copies this assay can detect is 250 copies / mL. A negative result does not preclude SARS-CoV-2 infection and should not be used as  the sole basis for treatment or other patient management decisions.  A negative result may occur with improper specimen collection / handling, submission of specimen other than nasopharyngeal swab, presence of viral mutation(s) within the areas targeted by this assay, and inadequate number of viral copies (<250 copies / mL). A negative result must be combined with clinical observations, patient history, and epidemiological information.  Fact Sheet for Patients:   BoilerBrush.com.cy  Fact Sheet for Healthcare Providers: https://pope.com/  This test is not yet approved or  cleared by the [place] FDA and has been authorized for detection and/or diagnosis of SARS-CoV-2 by FDA under an Emergency Use Authorization (EUA).  This EUA will remain in effect (meaning this test can be used) for the duration of the COVID-19 declaration under Section 564(b)(1) of the Act, 21 U.S.C. section 360bbb-3(b)(1), unless the authorization is terminated or revoked sooner.  Performed at [facility], [address], [place], [place] [address]          Radiology Studies: DG Chest 2 View  Result Date: 03/29/2020 CLINICAL DATA:  Chest pain EXAM:  CHEST - 2 VIEW COMPARISON:  CT and chest radiograph 03/25/2020 FINDINGS: Lungs are clear. No pneumothorax or pleural effusion. Rounded opacity within the right infrahilar region likely represents vascular shadow based on recent CT examination. The cardiomediastinal silhouette is unremarkable. Pulmonary vascularity normal. Osseous structures are age-appropriate. IMPRESSION: No active cardiopulmonary disease. Electronically Signed   By: Helyn Numbers MD   On: 03/29/2020 16:24        Scheduled Meds: . alprazolam  2 mg Oral TID AC & HS  . aspirin EC  81 mg Oral Daily  . atorvastatin  40 mg Oral Daily  . diclofenac sodium  4 g Topical QID  . doxepin  25 mg Oral QHS  . gabapentin  600 mg Oral TID  . lamoTRIgine  150 mg Oral Daily  . metoprolol succinate  12.5 mg Oral QPM  . nicotine  14 mg Transdermal Daily  . pantoprazole  40 mg Oral Daily  . sodium chloride flush  3 mL Intravenous Once   Continuous Infusions: . heparin 1,200 Units/hr (03/30/20 1112)     LOS: 0 days    Time spent: 25 minutes    Tresa Moore, MD [facility] Pager [phone]  If 7PM-7AM, please contact night-coverage 03/30/2020, 1:39 PM

## 2020-03-30 NOTE — Progress Notes (Signed)
Pt verbalized no relief from Ultram.  NP notified and will cont to observe.  SB 57.  Currently talking on the pone with her daughter.

## 2020-03-30 NOTE — Progress Notes (Signed)
Pt c/o that she didn't get her full 4mg  of po xanax, she got 2mg .  Pt states she takes 2mg  qid and 4mg  qhs.  Pt also states her pain is still 9/10 and still unchanged since all day.  NP notified.

## 2020-03-31 DIAGNOSIS — G43909 Migraine, unspecified, not intractable, without status migrainosus: Secondary | ICD-10-CM

## 2020-03-31 LAB — CBC
HCT: 35.6 % — ABNORMAL LOW (ref 36.0–46.0)
Hemoglobin: 11.8 g/dL — ABNORMAL LOW (ref 12.0–15.0)
MCH: 31.1 pg (ref 26.0–34.0)
MCHC: 33.1 g/dL (ref 30.0–36.0)
MCV: 93.9 fL (ref 80.0–100.0)
Platelets: 268 10*3/uL (ref 150–400)
RBC: 3.79 MIL/uL — ABNORMAL LOW (ref 3.87–5.11)
RDW: 12.9 % (ref 11.5–15.5)
WBC: 11.2 10*3/uL — ABNORMAL HIGH (ref 4.0–10.5)
nRBC: 0 % (ref 0.0–0.2)

## 2020-03-31 LAB — HEPARIN LEVEL (UNFRACTIONATED)
Heparin Unfractionated: 0.48 IU/mL (ref 0.30–0.70)
Heparin Unfractionated: 0.51 IU/mL (ref 0.30–0.70)

## 2020-03-31 MED ORDER — KETOROLAC TROMETHAMINE 30 MG/ML IJ SOLN: 30.0000 mg | Freq: Once | INTRAMUSCULAR | Status: DC

## 2020-03-31 MED ORDER — SODIUM CHLORIDE 0.9 % IV SOLN: 250.0000 mL | INTRAVENOUS | Status: DC | PRN

## 2020-03-31 MED ORDER — MORPHINE SULFATE (PF) 2 MG/ML IV SOLN
2.0000 mg | Freq: Once | INTRAVENOUS | Status: AC
  Administered 2020-03-31: 2 mg via INTRAVENOUS
  Filled 2020-03-31: qty 1

## 2020-03-31 MED ORDER — SODIUM CHLORIDE 0.9 % WEIGHT BASED INFUSION
1.0000 mL/kg/h | INTRAVENOUS | Status: DC
  Administered 2020-03-31: 1 mL/kg/h via INTRAVENOUS

## 2020-03-31 MED ORDER — METHOCARBAMOL 500 MG PO TABS
500.0000 mg | ORAL_TABLET | Freq: Once | ORAL | Status: AC
  Administered 2020-03-31: 500 mg via ORAL

## 2020-03-31 MED ORDER — ASPIRIN 81 MG PO CHEW
81.0000 mg | CHEWABLE_TABLET | ORAL | Status: AC
  Administered 2020-04-01: 81 mg via ORAL
  Filled 2020-03-31: qty 1

## 2020-03-31 MED ORDER — METHOCARBAMOL 500 MG PO TABS
500.0000 mg | ORAL_TABLET | Freq: Every evening | ORAL | Status: DC | PRN
  Administered 2020-03-31: 500 mg via ORAL
  Filled 2020-03-31 (×2): qty 1

## 2020-03-31 MED ORDER — METHOCARBAMOL 1000 MG/10ML IJ SOLN
500.0000 mg | Freq: Once | INTRAVENOUS | Status: AC
  Administered 2020-03-31: 500 mg via INTRAVENOUS
  Filled 2020-03-31: qty 5

## 2020-03-31 MED ORDER — METHOCARBAMOL 1000 MG/10ML IJ SOLN
500.0000 mg | Freq: Every evening | INTRAVENOUS | Status: DC | PRN
  Filled 2020-03-31 (×2): qty 5

## 2020-03-31 MED ORDER — SODIUM CHLORIDE 0.9% FLUSH: 3.0000 mL | INTRAVENOUS | Status: DC | PRN

## 2020-03-31 MED ORDER — PROMETHAZINE HCL 25 MG/ML IJ SOLN
25.0000 mg | Freq: Once | INTRAMUSCULAR | Status: AC
  Administered 2020-03-31: 25 mg via INTRAVENOUS
  Filled 2020-03-31: qty 1

## 2020-03-31 NOTE — Progress Notes (Signed)
Patient requested robaxin at bedtime. Patient received an intravenous dose the night before, but the order was changed to oral by the nurse practitioner. Explained to patient that the dosages are equivalent but she believes the oral will not work. Patient is very anxious. Patient has received her bedtime dose of xanax.

## 2020-03-31 NOTE — Progress Notes (Signed)
PROGRESS NOTE    Madison Carroll  WUJ:[id number] DOB: [birth year] DOA: 03/29/2020 PCP: Toy Cookey, FNP    Brief Narrative:  Madison Carroll  is a 54 y.o. female with a known history of bipolar disorder and anxiety TMJ and migraine.  She presents to the hospital with chest pain.  She came to the hospital on 03/29/2020 was having chest pain going on even before that.  She had a CT scan of the chest that was negative for pulmonary embolism at that time and sent home.  She followed up with Dr. Okey Dupre cardiology and was referred back to the emergency room for angina.  Patient was given aspirin in the cardiologist office.  Patient states the chest pain has been going on a few days prior to the 16th and has been constant in nature.  Sometimes up to a 9 out of 10 in intensity.  Her left arm is numb.  She is short of breath has periods of nausea vomiting.  Pain is mostly a pressure but then stabbing pains radiating to her back.  Nothing made the pain better or worse.  Today while she was vacuuming she got tired and had pain.  In the ER, first cardiac enzyme is negative.  Hospitalist services contacted for further evaluation.  Further testing based on cardiology opinion tomorrow.  Patient's father died the age of 42 of a heart attack  7/17: Patient seen and examined.  No chest pain at time of evaluation.  Does endorse migraine headache.  Case discussed with cardiology.  Given the patient's risk factors current plan of care is to proceed directly to cardiac catheterization.  This is tentatively planned for Monday, 04/01/2020.  7/18: Patient seen and examined.  Chest pain-free at this time.  Echocardiogram reviewed with patient.  Plan for cardiac catheterization Monday, 04/01/2020   Assessment & Plan:   Active Problems:   Unstable angina (HCC)  Unstable angina Patient had chest pain of unclear etiology.  Has been progressive for a long period of time.  No significant family history.  Patient was seen by Dr.  Excell Seltzer from [facility] cardiology.  Previously scheduled stress test canceled.  Plan to proceed with cardiac catheterization on Monday, 04/01/2020.  We will continue aspirin and heparin drip until my point.  Continue beta-blocker.  Anxiety and bipolar disorder Continue home psychiatric medications  TMJ Continue home gabapentin  Pulmonary nodule Incidental finding on recent chest CT Follow-up in 6 to 12 months  Tobacco abuse Nicotine patch ordered  Migraine Headache Symptoms still present.  Will minister morphine and Phenergan x1 again.  Patient states Fioricet was ineffective as well.  Does not wish to try steroids.   DVT prophylaxis: Heparin GTT Code Status: Full Family Communication: None today Disposition Plan: Status is: Inpatient  Remains inpatient appropriate because:Inpatient level of care appropriate due to severity of illness   Dispo: The patient is from: Home              Anticipated d/c is to: Home              Anticipated d/c date is: 2 days              Patient currently is not medically stable to d/c.  Plan for inpatient ischemic evaluation via cardiac catheterization 04/01/2020  Consultants:   Cardiology-[facility]  Procedures:   None  Antimicrobials:   None   Subjective: Seen and examined.  Continued complaint of migraine headache  Objective: Vitals:   03/30/20 1925 03/31/20 0430  03/31/20 0432 03/31/20 0739  BP: 125/89 98/61 109/72 101/68  Pulse: (!) 58 (!) 56 (!) 53 (!) 54  Resp: 19 17  17   Temp: 98.1 F (36.7 C) 98.1 F (36.7 C)  98.2 F (36.8 C)  TempSrc: Oral Oral    SpO2: 100% 95%  98%  Weight:   78 kg   Height:        Intake/Output Summary (Last 24 hours) at 03/31/2020 1054 Last data filed at 03/31/2020 0600 Gross per 24 hour  Intake 176.97 ml  Output 901 ml  Net -724.03 ml   Filed Weights   03/29/20 2043 03/30/20 0500 03/31/20 0432  Weight: 78.4 kg 78.1 kg 78 kg    Examination:  General exam: Appears calm and comfortable   Respiratory system: Clear to auscultation. Respiratory effort normal. Cardiovascular system: S1 & S2 heard, RRR. No JVD, murmurs, rubs, gallops or clicks. No pedal edema. Gastrointestinal system: Abdomen is nondistended, soft and nontender. No organomegaly or masses felt. Normal bowel sounds heard. Central nervous system: Alert and oriented. No focal neurological deficits. Extremities: Symmetric 5 x 5 power. Skin: No rashes, lesions or ulcers Psychiatry: Judgement and insight appear normal. Mood & affect appropriate.     Data Reviewed: I have personally reviewed following labs and imaging studies  CBC: Recent Labs  Lab 03/25/20 1510 03/29/20 1523 03/30/20 0059 03/31/20 0012  WBC 10.3 10.7* 11.0* 11.2*  HGB 11.6* 12.5 11.5* 11.8*  HCT 35.1* 35.0* 34.7* 35.6*  MCV 93.1 90.0 93.5 93.9  PLT 273 297 283 268   Basic Metabolic Panel: Recent Labs  Lab 03/25/20 1510 03/29/20 1523 03/30/20 0059  NA 136 136 139  K 4.0 4.1 3.6  CL 106 105 106  CO2 20* 22 25  GLUCOSE 87 99 96  BUN 10 11 10   CREATININE 0.77 0.92 0.92  CALCIUM 8.5* 8.7* 8.9   GFR: Estimated Creatinine Clearance: 69.1 mL/min (by C-G formula based on SCr of 0.92 mg/dL). Liver Function Tests: No results for input(s): AST, ALT, ALKPHOS, BILITOT, PROT, ALBUMIN in the last 168 hours. No results for input(s): LIPASE, AMYLASE in the last 168 hours. No results for input(s): AMMONIA in the last 168 hours. Coagulation Profile: Recent Labs  Lab 03/29/20 1904  INR 0.9   Cardiac Enzymes: No results for input(s): CKTOTAL, CKMB, CKMBINDEX, TROPONINI in the last 168 hours. BNP (last 3 results) No results for input(s): PROBNP in the last 8760 hours. HbA1C: No results for input(s): HGBA1C in the last 72 hours. CBG: No results for input(s): GLUCAP in the last 168 hours. Lipid Profile: Recent Labs    03/30/20 0059  CHOL 211*  HDL 51  LDLCALC 135*  TRIG 124  CHOLHDL 4.1   Thyroid Function Tests: No results for  input(s): TSH, T4TOTAL, FREET4, T3FREE, THYROIDAB in the last 72 hours. Anemia Panel: No results for input(s): VITAMINB12, FOLATE, FERRITIN, TIBC, IRON, RETICCTPCT in the last 72 hours. Sepsis Labs: No results for input(s): PROCALCITON, LATICACIDVEN in the last 168 hours.  Recent Results (from the past 240 hour(s))  SARS Coronavirus 2 by RT PCR (hospital order, performed in [facility] hospital lab) Nasopharyngeal Nasopharyngeal Swab     Status: None   Collection Time: 03/29/20  7:29 PM   Specimen: Nasopharyngeal Swab  Result Value Ref Range Status   SARS Coronavirus 2 NEGATIVE NEGATIVE Final    Comment: (NOTE) SARS-CoV-2 target nucleic acids are NOT DETECTED.  The SARS-CoV-2 RNA is generally detectable in upper and lower respiratory specimens during the  acute phase of infection. The lowest concentration of SARS-CoV-2 viral copies this assay can detect is 250 copies / mL. A negative result does not preclude SARS-CoV-2 infection and should not be used as the sole basis for treatment or other patient management decisions.  A negative result may occur with improper specimen collection / handling, submission of specimen other than nasopharyngeal swab, presence of viral mutation(s) within the areas targeted by this assay, and inadequate number of viral copies (<250 copies / mL). A negative result must be combined with clinical observations, patient history, and epidemiological information.  Fact Sheet for Patients:   BoilerBrush.com.cy  Fact Sheet for Healthcare Providers: https://pope.com/  This test is not yet approved or  cleared by the [place] FDA and has been authorized for detection and/or diagnosis of SARS-CoV-2 by FDA under an Emergency Use Authorization (EUA).  This EUA will remain in effect (meaning this test can be used) for the duration of the COVID-19 declaration under Section 564(b)(1) of the Act, 21 U.S.C. section  360bbb-3(b)(1), unless the authorization is terminated or revoked sooner.  Performed at [facility], [address], [place], [place] [address]          Radiology Studies: DG Chest 2 View  Result Date: 03/29/2020 CLINICAL DATA:  Chest pain EXAM: CHEST - 2 VIEW COMPARISON:  CT and chest radiograph 03/25/2020 FINDINGS: Lungs are clear. No pneumothorax or pleural effusion. Rounded opacity within the right infrahilar region likely represents vascular shadow based on recent CT examination. The cardiomediastinal silhouette is unremarkable. Pulmonary vascularity normal. Osseous structures are age-appropriate. IMPRESSION: No active cardiopulmonary disease. Electronically Signed   By: Helyn Numbers MD   On: 03/29/2020 16:24   ECHOCARDIOGRAM COMPLETE  Result Date: 03/30/2020    ECHOCARDIOGRAM REPORT   Patient Name:   [facility] Date of Exam: 03/30/2020 Medical Rec #:  [id number]     Height:       63.0 in Accession #:    [id number]    Weight:       172.1 lb Date of Birth:  11-Oct-1965    BSA:          1.814 m Patient Age:    53 years      BP:           109/70 mmHg Patient Gender: F             HR:           44 bpm. Exam Location:  [facility] Procedure: 2D Echo Indications:     Chest Pain  History:         Patient has no prior history of Echocardiogram examinations. Hx                  of Mitral Valve Prolapse.  Sonographer:     L Thornton-Maynard Referring Phys:  [id number] Alford Highland Diagnosing Phys: Julien Nordmann MD IMPRESSIONS  1. Left ventricular ejection fraction, by estimation, is 55%. The left ventricle has normal function. Select images concerning for hypokinesis of the basal anterior and anteroseptal region. Left ventricular diastolic parameters are consistent with Grade  II diastolic dysfunction (pseudonormalization).  2. Right ventricular systolic function is normal. The right ventricular size is normal. There is normal pulmonary artery systolic pressure. The estimated right ventricular  systolic pressure is 30.2 mmHg.  3. Tricuspid valve regurgitation is mild to moderate.  4. Aortic valve regurgitation is mild. FINDINGS  Left Ventricle: Left ventricular ejection fraction, by estimation, is 55 to 60%.  The left ventricle has normal function. The left ventricle has no regional wall motion abnormalities. The left ventricular internal cavity size was normal in size. There is  no left ventricular hypertrophy. Left ventricular diastolic parameters are consistent with Grade II diastolic dysfunction (pseudonormalization). Right Ventricle: The right ventricular size is normal. No increase in right ventricular wall thickness. Right ventricular systolic function is normal. There is normal pulmonary artery systolic pressure. The tricuspid regurgitant velocity is 2.25 m/s, and  with an assumed right atrial pressure of 10 mmHg, the estimated right ventricular systolic pressure is 30.2 mmHg. Left Atrium: Left atrial size was normal in size. Right Atrium: Right atrial size was normal in size. Pericardium: There is no evidence of pericardial effusion. Mitral Valve: The mitral valve is normal in structure. Normal mobility of the mitral valve leaflets. Mild mitral valve regurgitation. No evidence of mitral valve stenosis. Tricuspid Valve: The tricuspid valve is normal in structure. Tricuspid valve regurgitation is mild to moderate. No evidence of tricuspid stenosis. Aortic Valve: The aortic valve is normal in structure. Aortic valve regurgitation is mild. No aortic stenosis is present. Aortic valve mean gradient measures 6.0 mmHg. Aortic valve peak gradient measures 12.2 mmHg. Aortic valve area, by VTI measures 1.75  cm. Pulmonic Valve: The pulmonic valve was normal in structure. Pulmonic valve regurgitation is not visualized. No evidence of pulmonic stenosis. Aorta: The aortic root is normal in size and structure. Venous: The inferior vena cava is normal in size with greater than 50% respiratory variability,  suggesting right atrial pressure of 3 mmHg. IAS/Shunts: No atrial level shunt detected by color flow Doppler.  LEFT VENTRICLE PLAX 2D LVIDd:         5.45 cm  Diastology LVIDs:         3.81 cm  LV e' lateral:   8.27 cm/s LV PW:         0.97 cm  LV E/e' lateral: 11.5 LV IVS:        0.82 cm  LV e' medial:    5.66 cm/s LVOT diam:     2.00 cm  LV E/e' medial:  16.7 LV SV:         75 LV SV Index:   41 LVOT Area:     3.14 cm  RIGHT VENTRICLE RV S prime:     7.18 cm/s TAPSE (M-mode): 1.4 cm LEFT ATRIUM             Index LA diam:        3.40 cm 1.87 cm/m LA Vol (A2C):   53.4 ml 29.44 ml/m LA Vol (A4C):   51.6 ml 28.44 ml/m LA Biplane Vol: 52.4 ml 28.89 ml/m  AORTIC VALVE                    PULMONIC VALVE AV Area (Vmax):    1.75 cm     PV Vmax:       0.99 m/s AV Area (Vmean):   1.80 cm     PV Peak grad:  3.9 mmHg AV Area (VTI):     1.75 cm AV Vmax:           175.00 cm/s AV Vmean:          113.000 cm/s AV VTI:            0.427 m AV Peak Grad:      12.2 mmHg AV Mean Grad:      6.0 mmHg LVOT Vmax:  97.30 cm/s LVOT Vmean:        64.900 cm/s LVOT VTI:          0.238 m LVOT/AV VTI ratio: 0.56  AORTA Ao Root diam: 3.30 cm MITRAL VALVE               TRICUSPID VALVE MV Area (PHT): 3.12 cm    TR Peak grad:   20.2 mmHg MV Decel Time: 243 msec    TR Vmax:        225.00 cm/s MV E velocity: 94.70 cm/s MV A velocity: 78.40 cm/s  SHUNTS MV E/A ratio:  1.21        Systemic VTI:  0.24 m                            Systemic Diam: 2.00 cm Julien Nordmann MD Electronically signed by Julien Nordmann MD Signature Date/Time: 03/30/2020/10:36:49 PM    Final         Scheduled Meds: . alprazolam  2 mg Oral TID AC & HS  . aspirin EC  81 mg Oral Daily  . atorvastatin  40 mg Oral Daily  . diclofenac sodium  4 g Topical QID  . doxepin  25 mg Oral QHS  . gabapentin  600 mg Oral TID  . lamoTRIgine  150 mg Oral Daily  . metoprolol succinate  12.5 mg Oral QPM  .  morphine injection  2 mg Intravenous Once  . nicotine  14 mg  Transdermal Daily  . pantoprazole  40 mg Oral Daily  . promethazine  25 mg Intravenous Once  . sodium chloride flush  3 mL Intravenous Once   Continuous Infusions: . heparin 1,200 Units/hr (03/30/20 1414)  . methocarbamol (ROBAXIN) IV       LOS: 1 day    Time spent: 25 minutes    Tresa Moore, MD [facility] Pager [phone]  If 7PM-7AM, please contact night-coverage 03/31/2020, 10:54 AM

## 2020-03-31 NOTE — Progress Notes (Signed)
ANTICOAGULATION CONSULT NOTE  Pharmacy Consult for Heparin  Indication: chest pain/ACS  Allergies  Allergen Reactions  . Metoclopramide Hives and Other (See Comments)  . Codeine   . Sulfa Antibiotics   . Zofran [Ondansetron Hcl] Hives    Patient Measurements: Height: 5\' 3"  (160 cm) Weight: 78 kg (171 lb 14.4 oz) IBW/kg (Calculated) : 52.4 Heparin Dosing Weight: 71.6 kg   Vital Signs: Temp: 98.1 F (36.7 C) (07/18 0430) Temp Source: Oral (07/18 0430) BP: 109/72 (07/18 0432) Pulse Rate: 53 (07/18 0432)  Labs: Recent Labs    03/29/20 1523 03/29/20 1523 03/29/20 1904 03/30/20 0059 03/30/20 0746 03/30/20 1757 03/31/20 0012 03/31/20 0502  HGB 12.5   < >  --  11.5*  --   --  11.8*  --   HCT 35.0*  --   --  34.7*  --   --  35.6*  --   PLT 297  --   --  283  --   --  268  --   APTT  --   --  36  --   --   --   --   --   LABPROT  --   --  11.8  --   --   --   --   --   INR  --   --  0.9  --   --   --   --   --   HEPARINUNFRC  --   --   --  0.17*   < > 0.58 0.48 0.51  CREATININE 0.92  --   --  0.92  --   --   --   --   TROPONINIHS <2  --  <2  --   --   --   --   --    < > = values in this interval not displayed.    Estimated Creatinine Clearance: 69.1 mL/min (by C-G formula based on SCr of 0.92 mg/dL).   Medical History: Past Medical History:  Diagnosis Date  . Anxiety   . Bipolar 1 disorder (HCC)   . Mitral valve prolapse   . TMJ (dislocation of temporomandibular joint)      Assessment: Pharmacy consulted to dose heparin in this 54 year old female with ACS/NSTEMI.  CrCl = 71.1 ml/min No prior anticoag noted.   Goal of Therapy:  Heparin level 0.3-0.7 units/ml Monitor platelets by anticoagulation protocol: Yes   Plan:  07/18 @ 0500 HL 0.51 therapeutic. Will continue current rate and will recheck HL w/ am labs, CBC stable will continue to monitor.  8/18, PharmD, BCPS Clinical Pharmacist 03/31/2020 6:39 AM

## 2020-03-31 NOTE — Progress Notes (Signed)
ANTICOAGULATION CONSULT NOTE  Pharmacy Consult for Heparin  Indication: chest pain/ACS  Allergies  Allergen Reactions  . Metoclopramide Hives and Other (See Comments)  . Codeine   . Sulfa Antibiotics   . Zofran [Ondansetron Hcl] Hives    Patient Measurements: Height: 5\' 3"  (160 cm) Weight: 78.1 kg (172 lb 1.6 oz) IBW/kg (Calculated) : 52.4 Heparin Dosing Weight: 71.6 kg   Vital Signs: Temp: 98.1 F (36.7 C) (07/17 1925) Temp Source: Oral (07/17 1925) BP: 125/89 (07/17 1925) Pulse Rate: 58 (07/17 1925)  Labs: Recent Labs    03/29/20 1523 03/29/20 1523 03/29/20 1904 03/30/20 0059 03/30/20 0059 03/30/20 0746 03/30/20 1757 03/31/20 0012  HGB 12.5   < >  --  11.5*  --   --   --  11.8*  HCT 35.0*  --   --  34.7*  --   --   --  35.6*  PLT 297  --   --  283  --   --   --  268  APTT  --   --  36  --   --   --   --   --   LABPROT  --   --  11.8  --   --   --   --   --   INR  --   --  0.9  --   --   --   --   --   HEPARINUNFRC  --   --   --  0.17*   < > 0.16* 0.58 0.48  CREATININE 0.92  --   --  0.92  --   --   --   --   TROPONINIHS <2  --  <2  --   --   --   --   --    < > = values in this interval not displayed.    Estimated Creatinine Clearance: 69.2 mL/min (by C-G formula based on SCr of 0.92 mg/dL).   Medical History: Past Medical History:  Diagnosis Date  . Anxiety   . Bipolar 1 disorder (HCC)   . Mitral valve prolapse   . TMJ (dislocation of temporomandibular joint)      Assessment: Pharmacy consulted to dose heparin in this 54 year old female with ACS/NSTEMI.  CrCl = 71.1 ml/min No prior anticoag noted.   Goal of Therapy:  Heparin level 0.3-0.7 units/ml Monitor platelets by anticoagulation protocol: Yes   Plan:  07/18 @ 0000 HL 0.48 therapeutic. Will continue current rate and will recheck HL w/ am labs, CBC stable will continue to monitor.  8/18, PharmD, BCPS Clinical Pharmacist 03/31/2020 1:05 AM

## 2020-03-31 NOTE — Progress Notes (Addendum)
Update 1935: Pt refused bed alarm but was educated about safety. Will continue to monitor.  Pt is complaing of a 9/10 headache. Notify Jon Billings NP and ordered methocarbamol (ROBAXIN) 500 mg in dextrose 5 % 50 ml IVPB once. Will continue to monitor.

## 2020-03-31 NOTE — Progress Notes (Signed)
Progress Note  Patient Name: Madison Carroll Date of Encounter: 03/31/2020  [facility] Cardiologist: No primary care provider on file.   Subjective   No further chest pain or shortness of breath.  Wants to take a shower today.  Inpatient Medications    Scheduled Meds: . alprazolam  2 mg Oral TID AC & HS  . aspirin EC  81 mg Oral Daily  . atorvastatin  40 mg Oral Daily  . diclofenac sodium  4 g Topical QID  . doxepin  25 mg Oral QHS  . gabapentin  600 mg Oral TID  . lamoTRIgine  150 mg Oral Daily  . metoprolol succinate  12.5 mg Oral QPM  .  morphine injection  2 mg Intravenous Once  . nicotine  14 mg Transdermal Daily  . pantoprazole  40 mg Oral Daily  . promethazine  25 mg Intravenous Once  . sodium chloride flush  3 mL Intravenous Once   Continuous Infusions: . heparin 1,200 Units/hr (03/30/20 1414)  . methocarbamol (ROBAXIN) IV     PRN Meds: acetaminophen, acetaminophen, butalbital-acetaminophen-caffeine, methocarbamol (ROBAXIN) IV, ondansetron (ZOFRAN) IV, promethazine   Vital Signs    Vitals:   03/30/20 1925 03/31/20 0430 03/31/20 0432 03/31/20 0739  BP: 125/89 98/61 109/72 101/68  Pulse: (!) 58 (!) 56 (!) 53 (!) 54  Resp: 19 17  17   Temp: 98.1 F (36.7 C) 98.1 F (36.7 C)  98.2 F (36.8 C)  TempSrc: Oral Oral    SpO2: 100% 95%  98%  Weight:   78 kg   Height:        Intake/Output Summary (Last 24 hours) at 03/31/2020 1042 Last data filed at 03/31/2020 0600 Gross per 24 hour  Intake 176.97 ml  Output 901 ml  Net -724.03 ml   Last 3 Weights 03/31/2020 03/30/2020 03/29/2020  Weight (lbs) 171 lb 14.4 oz 172 lb 1.6 oz 172 lb 12.8 oz  Weight (kg) 77.973 kg 78.064 kg 78.382 kg      Telemetry    Sinus rhythm, no significant arrhythmia - Personally Reviewed   Physical Exam  Alert, oriented, NAD GEN: No acute distress.   Neck: No JVD Cardiac: RRR, no murmurs, rubs, or gallops.  Respiratory: Clear to auscultation bilaterally. GI: Soft, nontender,  non-distended  MS: No edema; No deformity. Neuro:  Nonfocal  Psych: Normal affect   Labs    High Sensitivity Troponin:   Recent Labs  Lab 03/25/20 1510 03/29/20 1523 03/29/20 1904  TROPONINIHS 3 <2 <2      Chemistry Recent Labs  Lab 03/25/20 1510 03/29/20 1523 03/30/20 0059  NA 136 136 139  K 4.0 4.1 3.6  CL 106 105 106  CO2 20* 22 25  GLUCOSE 87 99 96  BUN 10 11 10   CREATININE 0.77 0.92 0.92  CALCIUM 8.5* 8.7* 8.9  GFRNONAA >60 >60 >60  GFRAA >60 >60 >60  ANIONGAP 10 9 8      Hematology Recent Labs  Lab 03/29/20 1523 03/30/20 0059 03/31/20 0012  WBC 10.7* 11.0* 11.2*  RBC 3.89 3.71* 3.79*  HGB 12.5 11.5* 11.8*  HCT 35.0* 34.7* 35.6*  MCV 90.0 93.5 93.9  MCH 32.1 31.0 31.1  MCHC 35.7 33.1 33.1  RDW 13.1 12.9 12.9  PLT 297 283 268    BNPNo results for input(s): BNP, PROBNP in the last 168 hours.   DDimer No results for input(s): DDIMER in the last 168 hours.   Radiology    DG Chest 2 View  Result  Date: 03/29/2020 CLINICAL DATA:  Chest pain EXAM: CHEST - 2 VIEW COMPARISON:  CT and chest radiograph 03/25/2020 FINDINGS: Lungs are clear. No pneumothorax or pleural effusion. Rounded opacity within the right infrahilar region likely represents vascular shadow based on recent CT examination. The cardiomediastinal silhouette is unremarkable. Pulmonary vascularity normal. Osseous structures are age-appropriate. IMPRESSION: No active cardiopulmonary disease. Electronically Signed   By: Madison Numbers MD   On: 03/29/2020 16:24   ECHOCARDIOGRAM COMPLETE  Result Date: 03/30/2020    ECHOCARDIOGRAM REPORT   Patient Name:   Madison Carroll Date of Exam: 03/30/2020 Medical Rec #:  [id number]     Height:       63.0 in Accession #:    [id number]    Weight:       172.1 lb Date of Birth:  10/03/1965    BSA:          1.814 m Patient Age:    54 years      BP:           109/70 mmHg Patient Gender: F             HR:           44 bpm. Exam Location:  [facility] Procedure: 2D Echo  Indications:     Chest Pain  History:         Patient has no prior history of Echocardiogram examinations. Hx                  of Mitral Valve Prolapse.  Sonographer:     L Thornton-Maynard Referring Phys:  [id number] Alford Highland Diagnosing Phys: Julien Nordmann MD IMPRESSIONS  1. Left ventricular ejection fraction, by estimation, is 55%. The left ventricle has normal function. Select images concerning for hypokinesis of the basal anterior and anteroseptal region. Left ventricular diastolic parameters are consistent with Grade  II diastolic dysfunction (pseudonormalization).  2. Right ventricular systolic function is normal. The right ventricular size is normal. There is normal pulmonary artery systolic pressure. The estimated right ventricular systolic pressure is 30.2 mmHg.  3. Tricuspid valve regurgitation is mild to moderate.  4. Aortic valve regurgitation is mild. FINDINGS  Left Ventricle: Left ventricular ejection fraction, by estimation, is 55 to 60%. The left ventricle has normal function. The left ventricle has no regional wall motion abnormalities. The left ventricular internal cavity size was normal in size. There is  no left ventricular hypertrophy. Left ventricular diastolic parameters are consistent with Grade II diastolic dysfunction (pseudonormalization). Right Ventricle: The right ventricular size is normal. No increase in right ventricular wall thickness. Right ventricular systolic function is normal. There is normal pulmonary artery systolic pressure. The tricuspid regurgitant velocity is 2.25 m/s, and  with an assumed right atrial pressure of 10 mmHg, the estimated right ventricular systolic pressure is 30.2 mmHg. Left Atrium: Left atrial size was normal in size. Right Atrium: Right atrial size was normal in size. Pericardium: There is no evidence of pericardial effusion. Mitral Valve: The mitral valve is normal in structure. Normal mobility of the mitral valve leaflets. Mild mitral valve  regurgitation. No evidence of mitral valve stenosis. Tricuspid Valve: The tricuspid valve is normal in structure. Tricuspid valve regurgitation is mild to moderate. No evidence of tricuspid stenosis. Aortic Valve: The aortic valve is normal in structure. Aortic valve regurgitation is mild. No aortic stenosis is present. Aortic valve mean gradient measures 6.0 mmHg. Aortic valve peak gradient measures 12.2 mmHg. Aortic valve area, by VTI measures 1.75  cm.  Pulmonic Valve: The pulmonic valve was normal in structure. Pulmonic valve regurgitation is not visualized. No evidence of pulmonic stenosis. Aorta: The aortic root is normal in size and structure. Venous: The inferior vena cava is normal in size with greater than 50% respiratory variability, suggesting right atrial pressure of 3 mmHg. IAS/Shunts: No atrial level shunt detected by color flow Doppler.  LEFT VENTRICLE PLAX 2D LVIDd:         5.45 cm  Diastology LVIDs:         3.81 cm  LV e' lateral:   8.27 cm/s LV PW:         0.97 cm  LV E/e' lateral: 11.5 LV IVS:        0.82 cm  LV e' medial:    5.66 cm/s LVOT diam:     2.00 cm  LV E/e' medial:  16.7 LV SV:         75 LV SV Index:   41 LVOT Area:     3.14 cm  RIGHT VENTRICLE RV S prime:     7.18 cm/s TAPSE (M-mode): 1.4 cm LEFT ATRIUM             Index LA diam:        3.40 cm 1.87 cm/m LA Vol (A2C):   53.4 ml 29.44 ml/m LA Vol (A4C):   51.6 ml 28.44 ml/m LA Biplane Vol: 52.4 ml 28.89 ml/m  AORTIC VALVE                    PULMONIC VALVE AV Area (Vmax):    1.75 cm     PV Vmax:       0.99 m/s AV Area (Vmean):   1.80 cm     PV Peak grad:  3.9 mmHg AV Area (VTI):     1.75 cm AV Vmax:           175.00 cm/s AV Vmean:          113.000 cm/s AV VTI:            0.427 m AV Peak Grad:      12.2 mmHg AV Mean Grad:      6.0 mmHg LVOT Vmax:         97.30 cm/s LVOT Vmean:        64.900 cm/s LVOT VTI:          0.238 m LVOT/AV VTI ratio: 0.56  AORTA Ao Root diam: 3.30 cm MITRAL VALVE               TRICUSPID VALVE MV Area  (PHT): 3.12 cm    TR Peak grad:   20.2 mmHg MV Decel Time: 243 msec    TR Vmax:        225.00 cm/s MV E velocity: 94.70 cm/s MV A velocity: 78.40 cm/s  SHUNTS MV E/A ratio:  1.21        Systemic VTI:  0.24 m                            Systemic Diam: 2.00 cm Julien Nordmannimothy Gollan MD Electronically signed by Julien Nordmannimothy Gollan MD Signature Date/Time: 03/30/2020/10:36:49 PM    Final     Cardiac Studies   Echo: IMPRESSIONS    1. Left ventricular ejection fraction, by estimation, is 55%. The left  ventricle has normal function. Select images concerning for hypokinesis of  the basal anterior and anteroseptal region. Left ventricular diastolic  parameters are consistent with Grade  II diastolic dysfunction (pseudonormalization).  2. Right ventricular systolic function is normal. The right ventricular  size is normal. There is normal pulmonary artery systolic pressure. The  estimated right ventricular systolic pressure is 30.2 mmHg.  3. Tricuspid valve regurgitation is mild to moderate.  4. Aortic valve regurgitation is mild.   Patient Profile     54 y.o. female with history of mitral valve prolapse and tobacco use, family history of premature CAD, hospitalized with symptoms of unstable angina  Assessment & Plan    1.  Unstable angina: Objective markers negative to date.  Multiple CV risk factors noted, including extensive coronary calcification on chest CT.  Plans for cardiac catheterization and possible PCI as outlined in yesterday's note.  Echocardiogram reviewed with suggestion of anteroseptal hypokinesis.  Reviewed findings with patient.  Possible that she has LAD territory disease.  All questions answered.  Further disposition pending cardiac catheterization results tomorrow. 2.  Tobacco abuse: Need for complete cessation discussed with the patient. 3. Mixed hyperlipidemia: LDL cholesterol 135.  Patient was started on atorvastatin 40 mg daily yesterday.  Will need to follow-up lipids and LFTs in  about 12 weeks.    For questions or updates, please contact [facility] Please consult [email or web address] for contact info under    Signed, Tonny Bollman, MD  03/31/2020, 10:42 AM

## 2020-03-31 NOTE — Plan of Care (Signed)
  Problem: Health Behavior/Discharge Planning: Goal: Ability to manage health-related needs will improve Outcome: Progressing   Problem: Pain Managment: Goal: General experience of comfort will improve Outcome: Progressing   

## 2020-04-01 ENCOUNTER — Encounter: Payer: Self-pay | Admitting: Internal Medicine

## 2020-04-01 ENCOUNTER — Encounter
Admission: EM | Disposition: A | Discharge status: Home / Self Care | Payer: Self-pay | Source: Home / Self Care | Attending: Internal Medicine

## 2020-04-01 DIAGNOSIS — F319 Bipolar disorder, unspecified: Secondary | ICD-10-CM

## 2020-04-01 DIAGNOSIS — E785 Hyperlipidemia, unspecified: Secondary | ICD-10-CM

## 2020-04-01 DIAGNOSIS — I10 Essential (primary) hypertension: Secondary | ICD-10-CM

## 2020-04-01 DIAGNOSIS — I25110 Atherosclerotic heart disease of native coronary artery with unstable angina pectoris: Principal | ICD-10-CM

## 2020-04-01 DIAGNOSIS — R0789 Other chest pain: Secondary | ICD-10-CM

## 2020-04-01 HISTORY — PX: LEFT HEART CATH AND CORONARY ANGIOGRAPHY: CATH118249

## 2020-04-01 HISTORY — PX: INTRAVASCULAR PRESSURE WIRE/FFR STUDY: CATH118243

## 2020-04-01 LAB — CBC
HCT: 35.9 % — ABNORMAL LOW (ref 36.0–46.0)
Hemoglobin: 12.5 g/dL (ref 12.0–15.0)
MCH: 32.1 pg (ref 26.0–34.0)
MCHC: 34.8 g/dL (ref 30.0–36.0)
MCV: 92.3 fL (ref 80.0–100.0)
Platelets: 285 10*3/uL (ref 150–400)
RBC: 3.89 MIL/uL (ref 3.87–5.11)
RDW: 12.9 % (ref 11.5–15.5)
WBC: 10.8 10*3/uL — ABNORMAL HIGH (ref 4.0–10.5)
nRBC: 0 % (ref 0.0–0.2)

## 2020-04-01 LAB — HEPARIN LEVEL (UNFRACTIONATED): Heparin Unfractionated: 0.51 IU/mL (ref 0.30–0.70)

## 2020-04-01 LAB — POCT ACTIVATED CLOTTING TIME: Activated Clotting Time: 263 seconds

## 2020-04-01 SURGERY — LEFT HEART CATH AND CORONARY ANGIOGRAPHY
Anesthesia: Moderate Sedation

## 2020-04-01 MED ORDER — HEPARIN (PORCINE) IN NACL 1000-0.9 UT/500ML-% IV SOLN
INTRAVENOUS | Status: DC | PRN
  Administered 2020-04-01: 500 mL

## 2020-04-01 MED ORDER — SODIUM CHLORIDE 0.9% FLUSH
3.0000 mL | Freq: Two times a day (BID) | INTRAVENOUS | Status: DC
  Administered 2020-04-01 (×2): 3 mL via INTRAVENOUS

## 2020-04-01 MED ORDER — LABETALOL HCL 5 MG/ML IV SOLN: 10.0000 mg | INTRAVENOUS | Status: AC | PRN

## 2020-04-01 MED ORDER — HYDRALAZINE HCL 20 MG/ML IJ SOLN: 10.0000 mg | INTRAMUSCULAR | Status: AC | PRN

## 2020-04-01 MED ORDER — HEPARIN SODIUM (PORCINE) 1000 UNIT/ML IJ SOLN
INTRAMUSCULAR | Status: AC
  Filled 2020-04-01: qty 1

## 2020-04-01 MED ORDER — ATORVASTATIN CALCIUM 40 MG PO TABS: 40.0000 mg | ORAL_TABLET | Freq: Every day | ORAL | 0 refills | Status: AC

## 2020-04-01 MED ORDER — NITROGLYCERIN 1 MG/10 ML FOR IR/CATH LAB
INTRA_ARTERIAL | Status: DC | PRN
  Administered 2020-04-01: 200 ug via INTRACORONARY

## 2020-04-01 MED ORDER — MIDAZOLAM HCL 2 MG/2ML IJ SOLN
INTRAMUSCULAR | Status: AC
  Filled 2020-04-01: qty 2

## 2020-04-01 MED ORDER — PROMETHAZINE HCL 25 MG/ML IJ SOLN: 12.5000 mg | Freq: Four times a day (QID) | INTRAMUSCULAR | Status: DC | PRN

## 2020-04-01 MED ORDER — MIDAZOLAM HCL 2 MG/2ML IJ SOLN
INTRAMUSCULAR | Status: DC | PRN
  Administered 2020-04-01 (×2): 1 mg via INTRAVENOUS

## 2020-04-01 MED ORDER — HEPARIN SODIUM (PORCINE) 1000 UNIT/ML IJ SOLN
INTRAMUSCULAR | Status: DC | PRN
  Administered 2020-04-01 (×2): 4000 [IU] via INTRAVENOUS

## 2020-04-01 MED ORDER — FENTANYL CITRATE (PF) 100 MCG/2ML IJ SOLN
INTRAMUSCULAR | Status: AC
  Filled 2020-04-01: qty 2

## 2020-04-01 MED ORDER — KETOROLAC TROMETHAMINE 15 MG/ML IJ SOLN
15.0000 mg | Freq: Once | INTRAMUSCULAR | Status: AC
  Administered 2020-04-01: 15 mg via INTRAVENOUS
  Filled 2020-04-01: qty 1

## 2020-04-01 MED ORDER — NICOTINE 14 MG/24HR TD PT24: 14.0000 mg | MEDICATED_PATCH | Freq: Every day | TRANSDERMAL | 0 refills | Status: AC

## 2020-04-01 MED ORDER — SODIUM CHLORIDE 0.9% FLUSH
3.0000 mL | Freq: Two times a day (BID) | INTRAVENOUS | Status: DC
  Administered 2020-04-01: 3 mL via INTRAVENOUS

## 2020-04-01 MED ORDER — HEPARIN (PORCINE) IN NACL 1000-0.9 UT/500ML-% IV SOLN
INTRAVENOUS | Status: AC
  Filled 2020-04-01: qty 1000

## 2020-04-01 MED ORDER — SODIUM CHLORIDE 0.9 % IV SOLN: 250.0000 mL | INTRAVENOUS | Status: DC | PRN

## 2020-04-01 MED ORDER — KETOROLAC TROMETHAMINE 15 MG/ML IJ SOLN
15.0000 mg | Freq: Four times a day (QID) | INTRAMUSCULAR | Status: DC | PRN
  Administered 2020-04-01: 15 mg via INTRAVENOUS
  Filled 2020-04-01: qty 1

## 2020-04-01 MED ORDER — IOHEXOL 300 MG/ML  SOLN
INTRAMUSCULAR | Status: DC | PRN
  Administered 2020-04-01: 75 mL

## 2020-04-01 MED ORDER — FENTANYL CITRATE (PF) 100 MCG/2ML IJ SOLN
INTRAMUSCULAR | Status: DC | PRN
  Administered 2020-04-01: 50 ug via INTRAVENOUS

## 2020-04-01 MED ORDER — SODIUM CHLORIDE 0.9% FLUSH: 3.0000 mL | INTRAVENOUS | Status: DC | PRN

## 2020-04-01 MED ORDER — NITROGLYCERIN 1 MG/10 ML FOR IR/CATH LAB
INTRA_ARTERIAL | Status: AC
  Filled 2020-04-01: qty 10

## 2020-04-01 MED ORDER — PROMETHAZINE HCL 25 MG/ML IJ SOLN
INTRAMUSCULAR | Status: AC
  Filled 2020-04-01: qty 1

## 2020-04-01 MED ORDER — SODIUM CHLORIDE 0.9 % IV SOLN: INTRAVENOUS | Status: AC

## 2020-04-01 MED ORDER — ASPIRIN 81 MG PO TBEC: 81.0000 mg | DELAYED_RELEASE_TABLET | Freq: Every day | ORAL | 11 refills | Status: AC

## 2020-04-01 MED ORDER — ENOXAPARIN SODIUM 40 MG/0.4ML ~~LOC~~ SOLN
40.0000 mg | SUBCUTANEOUS | Status: DC
  Filled 2020-04-01: qty 0.4

## 2020-04-01 SURGICAL SUPPLY — 10 items
CATH 5F 110X4 TIG (CATHETERS) ×2 IMPLANT
CATH VISTA GUIDE 6FR XB3 (CATHETERS) ×2 IMPLANT
DEVICE INFLAT 30 PLUS (MISCELLANEOUS) ×2 IMPLANT
DEVICE RAD TR BAND REGULAR (VASCULAR PRODUCTS) ×2 IMPLANT
GLIDESHEATH SLEND SS 6F .021 (SHEATH) ×2 IMPLANT
GUIDEWIRE PRESS OMNI 185 ST (WIRE) ×2 IMPLANT
KIT MANI 3VAL PERCEP (MISCELLANEOUS) ×3 IMPLANT
PACK CARDIAC CATH (CUSTOM PROCEDURE TRAY) ×3 IMPLANT
WIRE HITORQ VERSACORE ST 145CM (WIRE) ×2 IMPLANT
WIRE ROSEN-J .035X260CM (WIRE) ×2 IMPLANT

## 2020-04-01 NOTE — Progress Notes (Signed)
Progress Note  Patient Name: Madison Carroll Date of Encounter: 04/01/2020  [facility] Cardiologist: Yvonne Kendall, MD  Subjective   Continues to have chest pain, similar to last week (never really resolved).  Also complains of a migraine today.  Patient is upset because her daughter has not been to see her since being hospitalized.  Inpatient Medications    Scheduled Meds: . [MAR Hold] alprazolam  2 mg Oral TID AC & HS  . [MAR Hold] aspirin EC  81 mg Oral Daily  . [MAR Hold] atorvastatin  40 mg Oral Daily  . [MAR Hold] diclofenac sodium  4 g Topical QID  . [MAR Hold] doxepin  25 mg Oral QHS  . [MAR Hold] gabapentin  600 mg Oral TID  . [MAR Hold] ketorolac  30 mg Intravenous Once  . [MAR Hold] lamoTRIgine  150 mg Oral Daily  . [MAR Hold] metoprolol succinate  12.5 mg Oral QPM  . [MAR Hold] nicotine  14 mg Transdermal Daily  . [MAR Hold] pantoprazole  40 mg Oral Daily   Continuous Infusions: . sodium chloride    . sodium chloride 1 mL/kg/hr (03/31/20 2358)  . heparin 1,200 Units/hr (03/31/20 1113)   PRN Meds: sodium chloride, [MAR Hold] acetaminophen, [MAR Hold] acetaminophen, [MAR Hold] butalbital-acetaminophen-caffeine, [MAR Hold] methocarbamol, [MAR Hold] ondansetron (ZOFRAN) IV, [MAR Hold] promethazine, sodium chloride flush   Vital Signs    Vitals:   03/31/20 2002 04/01/20 0001 04/01/20 0416 04/01/20 0749  BP: 114/76  130/84 128/69  Pulse: 65  (!) 49 (!) 46  Resp: 20   20  Temp: 98.9 F (37.2 C)  97.8 F (36.6 C)   TempSrc: Oral   Oral  SpO2: 97%  100%   Weight:  77.3 kg  77.3 kg  Height:    5\' 3"  (1.6 m)    Intake/Output Summary (Last 24 hours) at 04/01/2020 0753 Last data filed at 04/01/2020 0300 Gross per 24 hour  Intake 728.26 ml  Output --  Net 728.26 ml   Last 3 Weights 04/01/2020 04/01/2020 03/31/2020  Weight (lbs) 170 lb 6.7 oz 170 lb 6.4 oz 171 lb 14.4 oz  Weight (kg) 77.3 kg 77.293 kg 77.973 kg      Telemetry    NSR and sinus bradycardia  - Personally Reviewed  ECG    No new tracing  Physical Exam   GEN: Appears anxious.  Neck: No JVD Cardiac: Bradycardic but regular, no murmurs, rubs, or gallops.  Respiratory: Clear to auscultation bilaterally. GI: Soft, nontender, non-distended  MS: No edema; No deformity. Neuro:  Nonfocal  Psych: Labile affect.  Labs    High Sensitivity Troponin:   Recent Labs  Lab 03/25/20 1510 03/29/20 1523 03/29/20 1904  TROPONINIHS 3 <2 <2      Chemistry Recent Labs  Lab 03/25/20 1510 03/29/20 1523 03/30/20 0059  NA 136 136 139  K 4.0 4.1 3.6  CL 106 105 106  CO2 20* 22 25  GLUCOSE 87 99 96  BUN 10 11 10   CREATININE 0.77 0.92 0.92  CALCIUM 8.5* 8.7* 8.9  GFRNONAA >60 >60 >60  GFRAA >60 >60 >60  ANIONGAP 10 9 8      Hematology Recent Labs  Lab 03/30/20 0059 03/31/20 0012 04/01/20 0419  WBC 11.0* 11.2* 10.8*  RBC 3.71* 3.79* 3.89  HGB 11.5* 11.8* 12.5  HCT 34.7* 35.6* 35.9*  MCV 93.5 93.9 92.3  MCH 31.0 31.1 32.1  MCHC 33.1 33.1 34.8  RDW 12.9 12.9 12.9  PLT 283  268 285    BNPNo results for input(s): BNP, PROBNP in the last 168 hours.   DDimer No results for input(s): DDIMER in the last 168 hours.   Radiology    ECHOCARDIOGRAM COMPLETE  Result Date: 03/30/2020    ECHOCARDIOGRAM REPORT   Patient Name:   Madison Carroll Date of Exam: 03/30/2020 Medical Rec #:  [id number]     Height:       63.0 in Accession #:    [id number]    Weight:       172.1 lb Date of Birth:  05/24/1966    BSA:          1.814 m Patient Age:    54 years      BP:           109/70 mmHg Patient Gender: F             HR:           44 bpm. Exam Location:  [facility] Procedure: 2D Echo Indications:     Chest Pain  History:         Patient has no prior history of Echocardiogram examinations. Hx                  of Mitral Valve Prolapse.  Sonographer:     L Thornton-Maynard Referring Phys:  [id number] Alford Highland Diagnosing Phys: Julien Nordmann MD IMPRESSIONS  1. Left ventricular ejection fraction, by  estimation, is 55%. The left ventricle has normal function. Select images concerning for hypokinesis of the basal anterior and anteroseptal region. Left ventricular diastolic parameters are consistent with Grade  II diastolic dysfunction (pseudonormalization).  2. Right ventricular systolic function is normal. The right ventricular size is normal. There is normal pulmonary artery systolic pressure. The estimated right ventricular systolic pressure is 30.2 mmHg.  3. Tricuspid valve regurgitation is mild to moderate.  4. Aortic valve regurgitation is mild. FINDINGS  Left Ventricle: Left ventricular ejection fraction, by estimation, is 55 to 60%. The left ventricle has normal function. The left ventricle has no regional wall motion abnormalities. The left ventricular internal cavity size was normal in size. There is  no left ventricular hypertrophy. Left ventricular diastolic parameters are consistent with Grade II diastolic dysfunction (pseudonormalization). Right Ventricle: The right ventricular size is normal. No increase in right ventricular wall thickness. Right ventricular systolic function is normal. There is normal pulmonary artery systolic pressure. The tricuspid regurgitant velocity is 2.25 m/s, and  with an assumed right atrial pressure of 10 mmHg, the estimated right ventricular systolic pressure is 30.2 mmHg. Left Atrium: Left atrial size was normal in size. Right Atrium: Right atrial size was normal in size. Pericardium: There is no evidence of pericardial effusion. Mitral Valve: The mitral valve is normal in structure. Normal mobility of the mitral valve leaflets. Mild mitral valve regurgitation. No evidence of mitral valve stenosis. Tricuspid Valve: The tricuspid valve is normal in structure. Tricuspid valve regurgitation is mild to moderate. No evidence of tricuspid stenosis. Aortic Valve: The aortic valve is normal in structure. Aortic valve regurgitation is mild. No aortic stenosis is present. Aortic  valve mean gradient measures 6.0 mmHg. Aortic valve peak gradient measures 12.2 mmHg. Aortic valve area, by VTI measures 1.75  cm. Pulmonic Valve: The pulmonic valve was normal in structure. Pulmonic valve regurgitation is not visualized. No evidence of pulmonic stenosis. Aorta: The aortic root is normal in size and structure. Venous: The inferior vena cava is normal in size with greater than  50% respiratory variability, suggesting right atrial pressure of 3 mmHg. IAS/Shunts: No atrial level shunt detected by color flow Doppler.  LEFT VENTRICLE PLAX 2D LVIDd:         5.45 cm  Diastology LVIDs:         3.81 cm  LV e' lateral:   8.27 cm/s LV PW:         0.97 cm  LV E/e' lateral: 11.5 LV IVS:        0.82 cm  LV e' medial:    5.66 cm/s LVOT diam:     2.00 cm  LV E/e' medial:  16.7 LV SV:         75 LV SV Index:   41 LVOT Area:     3.14 cm  RIGHT VENTRICLE RV S prime:     7.18 cm/s TAPSE (M-mode): 1.4 cm LEFT ATRIUM             Index LA diam:        3.40 cm 1.87 cm/m LA Vol (A2C):   53.4 ml 29.44 ml/m LA Vol (A4C):   51.6 ml 28.44 ml/m LA Biplane Vol: 52.4 ml 28.89 ml/m  AORTIC VALVE                    PULMONIC VALVE AV Area (Vmax):    1.75 cm     PV Vmax:       0.99 m/s AV Area (Vmean):   1.80 cm     PV Peak grad:  3.9 mmHg AV Area (VTI):     1.75 cm AV Vmax:           175.00 cm/s AV Vmean:          113.000 cm/s AV VTI:            0.427 m AV Peak Grad:      12.2 mmHg AV Mean Grad:      6.0 mmHg LVOT Vmax:         97.30 cm/s LVOT Vmean:        64.900 cm/s LVOT VTI:          0.238 m LVOT/AV VTI ratio: 0.56  AORTA Ao Root diam: 3.30 cm MITRAL VALVE               TRICUSPID VALVE MV Area (PHT): 3.12 cm    TR Peak grad:   20.2 mmHg MV Decel Time: 243 msec    TR Vmax:        225.00 cm/s MV E velocity: 94.70 cm/s MV A velocity: 78.40 cm/s  SHUNTS MV E/A ratio:  1.21        Systemic VTI:  0.24 m                            Systemic Diam: 2.00 cm Julien Nordmannimothy Gollan MD Electronically signed by Julien Nordmannimothy Gollan MD Signature  Date/Time: 03/30/2020/10:36:49 PM    Final     Cardiac Studies   TTE (03/30/20): 1. Left ventricular ejection fraction, by estimation, is 55%. The left  ventricle has normal function. Select images concerning for hypokinesis of  the basal anterior and anteroseptal region. Left ventricular diastolic  parameters are consistent with Grade  II diastolic dysfunction (pseudonormalization).  2. Right ventricular systolic function is normal. The right ventricular  size is normal. There is normal pulmonary artery systolic pressure. The  estimated right ventricular systolic pressure is 30.2 mmHg.  3.  Tricuspid valve regurgitation is mild to moderate.  4. Aortic valve regurgitation is mild.   Patient Profile     54 y.o. female with history of mitral valve prolapse and tobacco use, family history of premature CAD, hospitalized with symptoms of unstable angina.  Assessment & Plan    Unstable angina: Continued chest pain since admission.  Troponin negative but echo with possible anteroseptal hypokinesis.  Recent CTA chest also notable for extensive coronary artery calcification.  Proceed with cardiac catheterization with possible PCI this morning.  I have reviewed the risks, indications, and alternatives to cardiac catheterization, possible angioplasty, and stenting with the patient. Risks include but are not limited to bleeding, infection, vascular injury, stroke, myocardial infection, arrhythmia, kidney injury, radiation-related injury in the case of prolonged fluoroscopy use, emergency cardiac surgery, and death. The patient understands the risks of serious complication is 1-2 in 1000 with diagnostic cardiac cath and 1-2% or less with angioplasty/stenting.  The patient is willing to proceed.  In the meantime, continue heparin infusion, aspirin, and atorvastatin.  Discontinue metoprolol given resting bradycardia into the 40's.  Hyperlipidemia:  Continue atorvastatin.  Tobacco  abuse:  Cessation encouraged.  For questions or updates, please contact [facility] Please consult [email or web address] for contact info under [facility].     Signed, Yvonne Kendall, MD  04/01/2020, 7:53 AM

## 2020-04-01 NOTE — Progress Notes (Signed)
Patient complaining of pain in chest radiating to back once returned from cath.   Gave patient one time dose of Toradol for pain and her anxiety medications.

## 2020-04-01 NOTE — Progress Notes (Signed)
Patient was able to rest after receiving phenergan and another dose of robaxin. Patient is unsteady on her feet and needs supervision.  Patient has requested to have bed alarm deactivated. Patient is not safe to ambulate independently.

## 2020-04-01 NOTE — Discharge Summary (Addendum)
Physician Discharge Summary  Madison Carroll ZOX:[id number] DOB: 1966/03/24 DOA: 03/29/2020  PCP: Toy Cookey, FNP  Admit date: 03/29/2020 Discharge date: 04/01/2020  Admitted From: Home Disposition:  Home  Recommendations for Outpatient Follow-up:  1. Follow up with PCP in 1-2 weeks 2. Follow up with Dr. Okey Dupre (cardiology) in 2 weeks  Home Health:No Equipment/Devices:None Discharge Condition:Stable CODE STATUS:Full Diet recommendation: Heart Healthy / Carb Modified Brief/Interim Summary: [name] a known history of bipolar disorder and anxiety TMJ and migraine. She presents to the hospital with chest pain. She came to the hospital on 03/29/2020 was having chest pain going on even before that. She had a CT scan of the chest that was negative for pulmonary embolism at that time and sent home. She followed up with Dr. Okey Dupre cardiology and was referred back to the emergency room for angina. Patient was given aspirin in the cardiologist office. Patient states the chest pain has been going on a few days prior to the 16th and has been constant in nature. Sometimes up to a 9 out of 10 in intensity. Her left arm is numb. She is short of breath has periods of nausea vomiting. Pain is mostly a pressure but then stabbing pains radiating to her back. Nothing made the pain better or worse. Today while she was vacuuming she got tired and had pain. In the ER, first cardiac enzyme is negative. Hospitalist services contacted for further evaluation. Further testing based on cardiology opinion tomorrow. Patient's father died the age of 68 of a heart attack  7/17: Patient seen and examined.  No chest pain at time of evaluation.  Does endorse migraine headache.  Case discussed with cardiology.  Given the patient's risk factors current plan of care is to proceed directly to cardiac catheterization.  This is tentatively planned for Monday, 04/01/2020.  7/18: Patient seen  and examined.  Chest pain-free at this time.  Echocardiogram reviewed with patient.  Plan for cardiac catheterization Monday, 04/01/2020  7/19: Heart patient seen and examined.  Status post left heart catheterization today.  No intervening lesions.  Recommending medical management.  Patient with some postprocedural chest pain.  Felt to be musculoskeletal in nature.  As needed Toradol x2 doses.  Good resolution.  Stable for discharge home.  Follow-up with PCP in 1 week.  Follow-up with cardiology in 2 weeks.  Aspirin and statin added to discharge medication reconciliation.  Smoking cessation encouraged.  Nicotine patches prescribed.  Discharge Diagnoses:  Active Problems:   Unstable angina (HCC) Unstable angina Patient had chest pain of unclear etiology.  Has been progressive for a long period of time.  No significant family history.  Patient was seen by Dr. Excell Seltzer from [facility] cardiology.  Previously scheduled stress test canceled.    Status post cardiac catheterization.  No intrarenal lesions.  No PCI performed.  Can discharge home at this time with close follow-up with primary care and cardiology.  Start aspirin and statin added to discharge medication reconciliation.  Anxiety and bipolar disorder Continue home psychiatric medications  TMJ Continue home gabapentin  Pulmonary nodule Incidental finding on recent chest CT Follow-up in 6 to 12 months  Tobacco abuse Nicotine patch ordered Smoking cessation education provided on discharge Nicotine patches prescribed  Migraine Headache Treated symptomatically.  Resolved the time of discharge   Discharge Instructions  Discharge Instructions    Diet - low sodium heart healthy   Complete by: As directed    Increase activity slowly   Complete by: As directed  Follow-up Information    End, Cristal Deer, MD. Schedule an appointment as soon as possible for a visit on 04/15/2020.   Specialty: Cardiology Why: At 2pm Contact  information: [address] [place] [address] [phone]        Toy Cookey, FNP. Schedule an appointment as soon as possible for a visit on 04/08/2020.   Specialty: Family Medicine Why: At 11:20am Contact information: [address] [place] [address] [phone]              Allergies  Allergen Reactions  . Metoclopramide Hives and Other (See Comments)  . Codeine   . Sulfa Antibiotics   . Zofran [Ondansetron Hcl] Hives    Consultations:  Cardiology-[facility]   Procedures/Studies: DG Chest 2 View  Result Date: 03/29/2020 CLINICAL DATA:  Chest pain EXAM: CHEST - 2 VIEW COMPARISON:  CT and chest radiograph 03/25/2020 FINDINGS: Lungs are clear. No pneumothorax or pleural effusion. Rounded opacity within the right infrahilar region likely represents vascular shadow based on recent CT examination. The cardiomediastinal silhouette is unremarkable. Pulmonary vascularity normal. Osseous structures are age-appropriate. IMPRESSION: No active cardiopulmonary disease. Electronically Signed   By: Helyn Numbers MD   On: 03/29/2020 16:24   DG Chest 2 View  Result Date: 03/25/2020 CLINICAL DATA:  Chest pain. Additional provided: Left-sided chest pain. Dizziness. Smoker. Mitral valve prolapse. EXAM: CHEST - 2 VIEW COMPARISON:  Prior chest radiographs 06/08/2019 and earlier FINDINGS: Heart size within normal limits. There is no appreciable airspace consolidation. No frank pulmonary edema. No no evidence of pleural effusion or pneumothorax. No acute bony abnormality identified. Exaggerated thoracic kyphosis with thoracic spondylosis. Surgical clips within the upper abdomen. Multiple small hyperdensities projecting in the region of the upper neck and chin are presumed related to a face mask overlying the patient. IMPRESSION: No evidence of acute cardiopulmonary abnormality. Electronically Signed   By: Jackey Loge DO   On: 03/25/2020 15:54   CT Angio Chest PE W and/or Wo  Contrast  Result Date: 03/25/2020 CLINICAL DATA:  Dyspnea, left chest pain EXAM: CT ANGIOGRAPHY CHEST WITH CONTRAST TECHNIQUE: Multidetector CT imaging of the chest was performed using the standard protocol during bolus administration of intravenous contrast. Multiplanar CT image reconstructions and MIPs were obtained to evaluate the vascular anatomy. CONTRAST:  75mL OMNIPAQUE IOHEXOL 350 MG/ML SOLN COMPARISON:  04/20/2010 FINDINGS: Cardiovascular: Satisfactory opacification of the pulmonary arteries to the segmental level. No evidence of pulmonary embolism. There is extensive coronary artery calcification noted. Normal heart size. No pericardial effusion. The thoracic aorta is suboptimally opacified, but is unremarkable. Mediastinum/Nodes: No enlarged mediastinal, hilar, or axillary lymph nodes. Thyroid gland, trachea, and esophagus demonstrate no significant findings. Lungs/Pleura: A 6 mm subpleural pulmonary nodule is seen within the left lower lobe, axial image 57/6, new since prior examination and indeterminate. The lungs are otherwise clear. No pneumothorax or pleural effusion. Central airways are widely patent. Upper Abdomen: No acute abnormality. Musculoskeletal: Degenerative changes are seen throughout the thoracic spine. No lytic or blastic bone lesions are seen. Review of the MIP images confirms the above findings. IMPRESSION: No pulmonary embolism. Extensive coronary artery calcification 6 mm indeterminate pulmonary nodule within the left lower lobe, new since prior examination. Non-contrast chest CT at 6-12 months is recommended. If the nodule is stable at time of repeat CT, then future CT at 18-24 months (from today's scan) is considered optional for low-risk patients, but is recommended for high-risk patients. This recommendation follows the consensus statement: Guidelines for Management of  Incidental Pulmonary Nodules Detected on CT Images: From the Fleischner Society 2017; Radiology 2017;  284:228-243. Electronically Signed   By: Helyn Numbers MD   On: 03/25/2020 18:20   CARDIAC CATHETERIZATION  Result Date: 04/01/2020 Conclusions: 1. Mild to moderate, nonobstructive coronary artery disease.  Most severe lesion is a 60% stenosis involving the mid LCx, which is not hemodynamically significant (iFR = 1.0). 2. Mildly elevated left ventricular filling pressure. Recommendations: 1. Medical therapy and risk factor modification to prevent progression of disease.  This includes aspirin and statin therapy as well as tobacco cessation. 2. Ongoing management of persistent chest pain, which is likely noncardiac in nature, per primary team. 3. If chest pain is under better control and there are no immediate post catheterization complications, it would be reasonable to discharge Ms. Toulouse later today and arrange for follow-up in our office in 2 weeks. Yvonne Kendall, MD [facility]   ECHOCARDIOGRAM COMPLETE  Result Date: 03/30/2020    ECHOCARDIOGRAM REPORT   Patient Name:   [facility] Date of Exam: 03/30/2020 Medical Rec #:  [id number]     Height:       63.0 in Accession #:    [id number]    Weight:       172.1 lb Date of Birth:  [birth year]    BSA:          1.814 m Patient Age:    53 years      BP:           109/70 mmHg Patient Gender: F             HR:           44 bpm. Exam Location:  [facility] Procedure: 2D Echo Indications:     Chest Pain  History:         Patient has no prior history of Echocardiogram examinations. Hx                  of Mitral Valve Prolapse.  Sonographer:     L Thornton-Maynard Referring Phys:  [id number] Alford Highland Diagnosing Phys: Julien Nordmann MD IMPRESSIONS  1. Left ventricular ejection fraction, by estimation, is 55%. The left ventricle has normal function. Select images concerning for hypokinesis of the basal anterior and anteroseptal region. Left ventricular diastolic parameters are consistent with Grade  II diastolic dysfunction (pseudonormalization).  2. Right  ventricular systolic function is normal. The right ventricular size is normal. There is normal pulmonary artery systolic pressure. The estimated right ventricular systolic pressure is 30.2 mmHg.  3. Tricuspid valve regurgitation is mild to moderate.  4. Aortic valve regurgitation is mild. FINDINGS  Left Ventricle: Left ventricular ejection fraction, by estimation, is 55 to 60%. The left ventricle has normal function. The left ventricle has no regional wall motion abnormalities. The left ventricular internal cavity size was normal in size. There is  no left ventricular hypertrophy. Left ventricular diastolic parameters are consistent with Grade II diastolic dysfunction (pseudonormalization). Right Ventricle: The right ventricular size is normal. No increase in right ventricular wall thickness. Right ventricular systolic function is normal. There is normal pulmonary artery systolic pressure. The tricuspid regurgitant velocity is 2.25 m/s, and  with an assumed right atrial pressure of 10 mmHg, the estimated right ventricular systolic pressure is 30.2 mmHg. Left Atrium: Left atrial size was normal in size. Right Atrium: Right atrial size was normal in size. Pericardium: There is no evidence of pericardial effusion. Mitral Valve: The mitral valve is normal in structure.  Normal mobility of the mitral valve leaflets. Mild mitral valve regurgitation. No evidence of mitral valve stenosis. Tricuspid Valve: The tricuspid valve is normal in structure. Tricuspid valve regurgitation is mild to moderate. No evidence of tricuspid stenosis. Aortic Valve: The aortic valve is normal in structure. Aortic valve regurgitation is mild. No aortic stenosis is present. Aortic valve mean gradient measures 6.0 mmHg. Aortic valve peak gradient measures 12.2 mmHg. Aortic valve area, by VTI measures 1.75  cm. Pulmonic Valve: The pulmonic valve was normal in structure. Pulmonic valve regurgitation is not visualized. No evidence of pulmonic  stenosis. Aorta: The aortic root is normal in size and structure. Venous: The inferior vena cava is normal in size with greater than 50% respiratory variability, suggesting right atrial pressure of 3 mmHg. IAS/Shunts: No atrial level shunt detected by color flow Doppler.  LEFT VENTRICLE PLAX 2D LVIDd:         5.45 cm  Diastology LVIDs:         3.81 cm  LV e' lateral:   8.27 cm/s LV PW:         0.97 cm  LV E/e' lateral: 11.5 LV IVS:        0.82 cm  LV e' medial:    5.66 cm/s LVOT diam:     2.00 cm  LV E/e' medial:  16.7 LV SV:         75 LV SV Index:   41 LVOT Area:     3.14 cm  RIGHT VENTRICLE RV S prime:     7.18 cm/s TAPSE (M-mode): 1.4 cm LEFT ATRIUM             Index LA diam:        3.40 cm 1.87 cm/m LA Vol (A2C):   53.4 ml 29.44 ml/m LA Vol (A4C):   51.6 ml 28.44 ml/m LA Biplane Vol: 52.4 ml 28.89 ml/m  AORTIC VALVE                    PULMONIC VALVE AV Area (Vmax):    1.75 cm     PV Vmax:       0.99 m/s AV Area (Vmean):   1.80 cm     PV Peak grad:  3.9 mmHg AV Area (VTI):     1.75 cm AV Vmax:           175.00 cm/s AV Vmean:          113.000 cm/s AV VTI:            0.427 m AV Peak Grad:      12.2 mmHg AV Mean Grad:      6.0 mmHg LVOT Vmax:         97.30 cm/s LVOT Vmean:        64.900 cm/s LVOT VTI:          0.238 m LVOT/AV VTI ratio: 0.56  AORTA Ao Root diam: 3.30 cm MITRAL VALVE               TRICUSPID VALVE MV Area (PHT): 3.12 cm    TR Peak grad:   20.2 mmHg MV Decel Time: 243 msec    TR Vmax:        225.00 cm/s MV E velocity: 94.70 cm/s MV A velocity: 78.40 cm/s  SHUNTS MV E/A ratio:  1.21        Systemic VTI:  0.24 m  Systemic Diam: 2.00 cm Julien Nordmann MD Electronically signed by Julien Nordmann MD Signature Date/Time: 03/30/2020/10:36:49 PM    Final    (Echo, Carotid, EGD, Colonoscopy, ERCP)    Subjective: Patient seen and examined on day of discharge.  Post catheterization.  Had some post-cath chest pain resolved the time my evaluation.  Stable for discharge  home.  Discharge Exam: Vitals:   04/01/20 1155 04/01/20 1538  BP: 108/71 127/73  Pulse: (!) 58 (!) 57  Resp: 18 19  Temp: 98 F (36.7 C) 98 F (36.7 C)  SpO2: 100% 99%   Vitals:   04/01/20 1030 04/01/20 1122 04/01/20 1155 04/01/20 1538  BP: 102/83 122/82 108/71 127/73  Pulse: (!) 40 (!) 50 (!) 58 (!) 57  Resp: Temp:  97.9 F (36.6 C) 98 F (36.7 C) 98 F (36.7 C)  TempSrc:  Oral Oral   SpO2: 100% 100% 100% 99%  Weight:      Height:        General: Pt is alert, awake, not in acute distress Cardiovascular: RRR, S1/S2 +, no rubs, no gallops Respiratory: CTA bilaterally, no wheezing, no rhonchi Abdominal: Soft, NT, ND, bowel sounds + Extremities: no edema, no cyanosis    The results of significant diagnostics from this hospitalization (including imaging, microbiology, ancillary and laboratory) are listed below for reference.     Microbiology: Recent Results (from the past 240 hour(s))  SARS Coronavirus 2 by RT PCR (hospital order, performed in [facility] hospital lab) Nasopharyngeal Nasopharyngeal Swab     Status: None   Collection Time: 03/29/20  7:29 PM   Specimen: Nasopharyngeal Swab  Result Value Ref Range Status   SARS Coronavirus 2 NEGATIVE NEGATIVE Final    Comment: (NOTE) SARS-CoV-2 target nucleic acids are NOT DETECTED.  The SARS-CoV-2 RNA is generally detectable in upper and lower respiratory specimens during the acute phase of infection. The lowest concentration of SARS-CoV-2 viral copies this assay can detect is 250 copies / mL. A negative result does not preclude SARS-CoV-2 infection and should not be used as the sole basis for treatment or other patient management decisions.  A negative result may occur with improper specimen collection / handling, submission of specimen other than nasopharyngeal swab, presence of viral mutation(s) within the areas targeted by this assay, and inadequate number of viral copies (<250 copies / mL). A  negative result must be combined with clinical observations, patient history, and epidemiological information.  Fact Sheet for Patients:   BoilerBrush.com.cy  Fact Sheet for Healthcare Providers: https://pope.com/  This test is not yet approved or  cleared by the [place] FDA and has been authorized for detection and/or diagnosis of SARS-CoV-2 by FDA under an Emergency Use Authorization (EUA).  This EUA will remain in effect (meaning this test can be used) for the duration of the COVID-19 declaration under Section 564(b)(1) of the Act, 21 U.S.C. section 360bbb-3(b)(1), unless the authorization is terminated or revoked sooner.  Performed at [facility], [address] [place], [place] [address]      Labs: BNP (last 3 results) No results for input(s): BNP in the last 8760 hours. Basic Metabolic Panel: Recent Labs  Lab 03/29/20 1523 03/30/20 0059  NA 136 139  K 4.1 3.6  CL 105 106  CO2 22 25  GLUCOSE 99 96  BUN 11 10  CREATININE 0.92 0.92  CALCIUM 8.7* 8.9   Liver Function Tests: No results for input(s): AST, ALT, ALKPHOS, BILITOT, PROT, ALBUMIN  in the last 168 hours. No results for input(s): LIPASE, AMYLASE in the last 168 hours. No results for input(s): AMMONIA in the last 168 hours. CBC: Recent Labs  Lab 03/29/20 1523 03/30/20 0059 03/31/20 0012 04/01/20 0419  WBC 10.7* 11.0* 11.2* 10.8*  HGB 12.5 11.5* 11.8* 12.5  HCT 35.0* 34.7* 35.6* 35.9*  MCV 90.0 93.5 93.9 92.3  PLT 297 283 268 285   Cardiac Enzymes: No results for input(s): CKTOTAL, CKMB, CKMBINDEX, TROPONINI in the last 168 hours. BNP: Invalid input(s): POCBNP CBG: No results for input(s): GLUCAP in the last 168 hours. D-Dimer No results for input(s): DDIMER in the last 72 hours. Hgb A1c No results for input(s): HGBA1C in the last 72 hours. Lipid Profile Recent Labs    03/30/20 0059  CHOL 211*  HDL 51  LDLCALC 135*  TRIG  124  CHOLHDL 4.1   Thyroid function studies No results for input(s): TSH, T4TOTAL, T3FREE, THYROIDAB in the last 72 hours.  Invalid input(s): FREET3 Anemia work up No results for input(s): VITAMINB12, FOLATE, FERRITIN, TIBC, IRON, RETICCTPCT in the last 72 hours. Urinalysis    Component Value Date/Time   COLORURINE Yellow 12/09/2014 1851   COLORURINE YELLOW 07/11/2009 2051   APPEARANCEUR Hazy 12/09/2014 1851   LABSPEC 1.020 12/09/2014 1851   PHURINE 5.0 12/09/2014 1851   PHURINE 5.5 07/11/2009 2051   GLUCOSEU Negative 12/09/2014 1851   HGBUR Negative 12/09/2014 1851   HGBUR NEGATIVE 07/11/2009 2051   BILIRUBINUR Negative 12/09/2014 1851   KETONESUR Negative 12/09/2014 1851   KETONESUR NEGATIVE 07/11/2009 2051   PROTEINUR Negative 12/09/2014 1851   PROTEINUR NEGATIVE 07/11/2009 2051   UROBILINOGEN 0.2 07/11/2009 2051   NITRITE Negative 12/09/2014 1851   NITRITE NEGATIVE 07/11/2009 2051   LEUKOCYTESUR 2+ 12/09/2014 1851   Sepsis Labs Invalid input(s): PROCALCITONIN,  WBC,  LACTICIDVEN Microbiology Recent Results (from the past 240 hour(s))  SARS Coronavirus 2 by RT PCR (hospital order[facility]) Nasopharyngeal Nasopharyngeal Swab     Status: None   Collection Time: 03/29/20  7:29 PM   Specimen: Nasopharyngeal Swab  Result Value Ref Range Status   SARS Coronavirus 2 NEGATIVE NEGATIVE Final    Comment: (NOTE) SARS-CoV-2 target nucleic acids are NOT DETECTED.  The SARS-CoV-2 RNA is generally detectable in upper and lower respiratory specimens during the acute phase of infection. The lowest concentration of SARS-CoV-2 viral copies this assay can detect is 250 copies / mL. A negative result does not preclude SARS-CoV-2 infection and should not be used as the sole basis for treatment or other patient management decisions.  A negative result may occur with improper specimen collection / handling, submission of specimen other than nasopharyngeal  swab, presence of viral mutation(s) within the areas targeted by this assay, and inadequate number of viral copies (<250 copies / mL). A negative result must be combined with clinical observations, patient history, and epidemiological information.  Fact Sheet for Patients:   BoilerBrush.com.cyhttps://www.fda.gov/media/136312/download  Fact Sheet for Healthcare Providers: https://pope.com/https://www.fda.gov/media/136313/download  This test is not yet approved or  cleared by the [place] FDA and has been authorized for detection and/or diagnosis of SARS-CoV-2 by FDA under an Emergency Use Authorization (EUA).  This EUA will remain in effect (meaning this test can be used) for the duration of the COVID-19 declaration under Section 564(b)(1) of the Act, 21 U.S.C. section 360bbb-3(b)(1), unless the authorization is terminated or revoked sooner.  Performed at [facility], [address] [place], [place] [id number]  Time coordinating discharge: Over 30 minutes  SIGNED:   Tresa Moore, MD  [facility] 04/01/2020, 3:59 PM Pager   If 7PM-7AM, please contact night-coverage

## 2020-04-01 NOTE — Discharge Instructions (Signed)
Angina  Angina is extreme discomfort in the chest, neck, arm, jaw, or back. The discomfort is caused by a lack of blood in the middle layer of the heart wall (myocardium). There are four types of angina:  Stable angina. This is triggered by vigorous activity or exercise. It goes away when you rest or take angina medicine.  Unstable angina. This is a warning sign and can lead to a heart attack (acute coronary syndrome). This is a medical emergency. Symptoms come at rest and last a long time.  Microvascular angina. This affects the small coronary arteries. Symptoms include feeling tired and being short of breath.  Prinzmetal or variant angina. This is caused by a tightening (spasm) of the arteries that go to your heart. What are the causes? This condition is caused by atherosclerosis. This is the buildup of fat and cholesterol (plaque) in your arteries. The plaque may narrow or block the artery. Other causes of angina include:  Sudden tightening of the muscles of the arteries in the heart (coronary spasm).  Small artery disease (microvascular dysfunction).  Problems with any of your heart valves (heart valve disease).  A tear in an artery in your heart (coronary artery dissection).  Diseases of the heart muscle (cardiomyopathy), or other heart diseases. What increases the risk? You are more likely to develop this condition if you have:  High cholesterol.  High blood pressure (hypertension).  Diabetes.  A family history of heart disease.  An inactive (sedentary) lifestyle, or you do not exercise enough.  Depression.  Had radiation treatment to the left side of your chest. Other risk factors include:  Using tobacco.  Being obese.  Eating a diet high in saturated fats.  Being exposed to high stress or triggers of stress.  Using drugs, such as cocaine. Women have a greater risk for angina if:  They are older than 55.  They have gone through menopause (are  postmenopausal). What are the signs or symptoms? Common symptoms of this condition in both men and women may include:  Chest pain, which may: ? Feel like a crushing or squeezing in the chest, or like a tightness, pressure, fullness, or heaviness in the chest. ? Last for more than a few minutes at a time, or it may stop and come back (recur) over the course of a few minutes.  Pain in the neck, arm, jaw, or back.  Unexplained heartburn or indigestion.  Shortness of breath.  Nausea.  Sudden cold sweats. Women and people with diabetes may have unusual (atypical) symptoms, such as:  Fatigue.  Unexplained feelings of nervousness or anxiety.  Unexplained weakness.  Dizziness or fainting. How is this diagnosed? This condition may be diagnosed based on:  Your symptoms and medical history.  Electrocardiogram (ECG) to measure the electrical activity in your heart.  Blood tests.  Stress test to look for signs of blockage when your heart is stressed.  CT angiogram to examine your heart and the blood flow to it.  Coronary angiogram to check your coronary arteries for blockage. How is this treated? Angina may be treated with:  Medicines to: ? Prevent blood clots and heart attack. ? Relax blood vessels and improve blood flow to the heart (nitrates). ? Reduce blood pressure, improve the pumping action of the heart, and relax blood vessels that are spasming. ? Reduce cholesterol and help treat atherosclerosis.  A procedure to widen a narrowed or blocked coronary artery (angioplasty). A mesh tube may be placed in a coronary artery to   keep it open (coronary stenting).  Surgery to allow blood to go around a blocked artery (coronary artery bypass surgery). Follow these instructions at home: Medicines  Take over-the-counter and prescription medicines only as told by your health care provider.  Do not take the following medicines unless your health care provider approves: ? NSAIDs,  such as ibuprofen or naproxen. ? Vitamin supplements that contain vitamin A, vitamin E, or both. ? Hormone replacement therapy that contains estrogen with or without progestin. Eating and drinking   Eat a heart-healthy diet. This includes plenty of fresh fruits and vegetables, whole grains, low-fat (lean) protein, and low-fat dairy products.  Follow instructions from your health care provider about eating or drinking restrictions. Activity  Follow an exercise program approved by your health care provider.  Consider joining a cardiac rehabilitation program.  Take a break when you feel fatigued. Plan rest periods in your daily activities. Lifestyle   Do not use any products that contain nicotine or tobacco, such as cigarettes, e-cigarettes, and chewing tobacco. If you need help quitting, ask your health care provider.  If your health care provider says you can drink alcohol: ? Limit how much you use to:  0-1 drink a day for nonpregnant women.  0-2 drinks a day for men. ? Be aware of how much alcohol is in your drink. In the [place] one drink equals one 12 oz bottle of beer (355 mL), one 5 oz glass of wine (148 mL), or one 1 oz glass of hard liquor (44 mL). General instructions  Maintain a healthy weight.  Learn to manage stress.  Keep your vaccinations up to date. Get the flu (influenza) vaccine every year.  Talk to your health care provider if you feel depressed. Take a depression screening test to see if you are at risk for depression.  Work with your health care provider to manage other health conditions, such as hypertension or diabetes.  Keep all follow-up visits as told by your health care provider. This is important. Get help right away if:  You have pain in your chest, neck, arm, jaw, or back, and the pain: ? Lasts more than a few minutes. ? Is recurring. ? Is not relieved by taking medicines under the tongue (sublingual nitroglycerin). ? Increases in intensity or  frequency.  You have a lot of sweating without cause.  You have unexplained: ? Heartburn or indigestion. ? Shortness of breath or difficulty breathing. ? Nausea or vomiting. ? Fatigue. ? Feelings of nervousness or anxiety. ? Weakness.  You have sudden light-headedness or dizziness.  You faint. These symptoms may represent a serious problem that is an emergency. Do not wait to see if the symptoms will go away. Get medical help right away. Call your local emergency services (911 in the [place] Do not drive yourself to the hospital. Summary  Angina is extreme discomfort in the chest, neck, arm, jaw, or back that is caused by a lack of blood in the heart wall.  There are many symptoms of angina. They include chest pain, unexplained heartburn or indigestion, sudden cold sweats, and fatigue.  Angina may be treated with behavioral changes, medicine, or surgery.  Symptoms of angina may represent an emergency. Get medical help right away. Call your local emergency services (911 in the [place] Do not drive yourself to the hospital. This information is not intended to replace advice given to you by your health care provider. Make sure you discuss any questions you have with your health care provider.   Document Revised: 04/18/2018 Document Reviewed: 04/18/2018 Elsevier Patient Education  2020 [employer].  Coping with Quitting Smoking  Quitting smoking is a physical and mental challenge. You will face cravings, withdrawal symptoms, and temptation. Before quitting, work with your health care provider to make a plan that can help you cope. Preparation can help you quit and keep you from giving in. How can I cope with cravings? Cravings usually last for 5-10 minutes. If you get through it, the craving will pass. Consider taking the following actions to help you cope with cravings:  Keep your mouth busy: ? Chew sugar-free gum. ? Suck on hard candies or a straw. ? Brush your teeth.  Keep your  hands and body busy: ? Immediately change to a different activity when you feel a craving. ? Squeeze or play with a ball. ? Do an activity or a hobby, like making bead jewelry, practicing needlepoint, or working with wood. ? Mix up your normal routine. ? Take a short exercise break. Go for a quick walk or run up and down stairs. ? Spend time in public places where smoking is not allowed.  Focus on doing something kind or helpful for someone else.  Call a friend or family member to talk during a craving.  Join a support group.  Call a quit line, such as 1-800-QUIT-NOW.  Talk with your health care provider about medicines that might help you cope with cravings and make quitting easier for you. How can I deal with withdrawal symptoms? Your body may experience negative effects as it tries to get used to not having nicotine in the system. These effects are called withdrawal symptoms. They may include:  Feeling hungrier than normal.  Trouble concentrating.  Irritability.  Trouble sleeping.  Feeling depressed.  Restlessness and agitation.  Craving a cigarette. To manage withdrawal symptoms:  Avoid places, people, and activities that trigger your cravings.  Remember why you want to quit.  Get plenty of sleep.  Avoid coffee and other caffeinated drinks. These may worsen some of your symptoms. How can I handle social situations? Social situations can be difficult when you are quitting smoking, especially in the first few weeks. To manage this, you can:  Avoid parties, bars, and other social situations where people might be smoking.  Avoid alcohol.  Leave right away if you have the urge to smoke.  Explain to your family and friends that you are quitting smoking. Ask for understanding and support.  Plan activities with friends or family where smoking is not an option. What are some ways I can cope with stress? Wanting to smoke may cause stress, and stress can make you want  to smoke. Find ways to manage your stress. Relaxation techniques can help. For example:  Breathe slowly and deeply, in through your nose and out through your mouth.  Listen to soothing, relaxing music.  Talk with a family member or friend about your stress.  Light a candle.  Soak in a bath or take a shower.  Think about a peaceful place. What are some ways I can prevent weight gain? Be aware that many people gain weight after they quit smoking. However, not everyone does. To keep from gaining weight, have a plan in place before you quit and stick to the plan after you quit. Your plan should include:  Having healthy snacks. When you have a craving, it may help to: ? Eat plain popcorn, crunchy carrots, celery, or other cut vegetables. ? Chew sugar-free gum.  Changing  how you eat: ? Eat small portion sizes at meals. ? Eat 4-6 small meals throughout the day instead of 1-2 large meals a day. ? Be mindful when you eat. Do not watch television or do other things that might distract you as you eat.  Exercising regularly: ? Make time to exercise each day. If you do not have time for a long workout, do short bouts of exercise for 5-10 minutes several times a day. ? Do some form of strengthening exercise, like weight lifting, and some form of aerobic exercise, like running or swimming.  Drinking plenty of water or other low-calorie or no-calorie drinks. Drink 6-8 glasses of water daily, or as much as instructed by your health care provider. Summary  Quitting smoking is a physical and mental challenge. You will face cravings, withdrawal symptoms, and temptation to smoke again. Preparation can help you as you go through these challenges.  You can cope with cravings by keeping your mouth busy (such as by chewing gum), keeping your body and hands busy, and making calls to family, friends, or a helpline for people who want to quit smoking.  You can cope with withdrawal symptoms by avoiding places  where people smoke, avoiding drinks with caffeine, and getting plenty of rest.  Ask your health care provider about the different ways to prevent weight gain, avoid stress, and handle social situations. This information is not intended to replace advice given to you by your health care provider. Make sure you discuss any questions you have with your health care provider. Document Revised: 08/13/2017 Document Reviewed: 08/28/2016 Elsevier Patient Education  2020 [employer].

## 2020-04-01 NOTE — Interval H&P Note (Signed)
History and Physical Interval Note:  04/01/2020 8:05 AM  Madison Carroll  has presented today for surgery, with the diagnosis of unstable angina.  The various methods of treatment have been discussed with the patient and family. After consideration of risks, benefits and other options for treatment, the patient has consented to  Procedure(s): LEFT HEART CATH AND CORONARY ANGIOGRAPHY (N/A) as a surgical intervention.  The patient's history has been reviewed, patient examined, no change in status, stable for surgery.  I have reviewed the patient's chart and labs.  Questions were answered to the patient's satisfaction.    Cath Lab Visit (complete for each Cath Lab visit)  Clinical Evaluation Leading to the Procedure:   ACS: Yes.    Non-ACS:  N/A  Quamaine Webb

## 2020-04-01 NOTE — Progress Notes (Signed)
Pt. C/o nausea. States she will not take zofran "because it makes me feel funny." Contacted Dr. Okey Dupre secondary to pt. States she prefers not to have oral phenerganl.New order received. Pt. Then med. With Phenergan 12.5 mg slow IVP per orders over 3 min.

## 2020-04-01 NOTE — H&P (View-Only) (Signed)
Progress Note  Patient Name: Madison Carroll Date of Encounter: 04/01/2020  [facility] Cardiologist: Yvonne Kendall, MD  Subjective   Continues to have chest pain, similar to last week (never really resolved).  Also complains of a migraine today.  Patient is upset because her daughter has not been to see her since being hospitalized.  Inpatient Medications    Scheduled Meds: . [MAR Hold] alprazolam  2 mg Oral TID AC & HS  . [MAR Hold] aspirin EC  81 mg Oral Daily  . [MAR Hold] atorvastatin  40 mg Oral Daily  . [MAR Hold] diclofenac sodium  4 g Topical QID  . [MAR Hold] doxepin  25 mg Oral QHS  . [MAR Hold] gabapentin  600 mg Oral TID  . [MAR Hold] ketorolac  30 mg Intravenous Once  . [MAR Hold] lamoTRIgine  150 mg Oral Daily  . [MAR Hold] metoprolol succinate  12.5 mg Oral QPM  . [MAR Hold] nicotine  14 mg Transdermal Daily  . [MAR Hold] pantoprazole  40 mg Oral Daily   Continuous Infusions: . sodium chloride    . sodium chloride 1 mL/kg/hr (03/31/20 2358)  . heparin 1,200 Units/hr (03/31/20 1113)   PRN Meds: sodium chloride, [MAR Hold] acetaminophen, [MAR Hold] acetaminophen, [MAR Hold] butalbital-acetaminophen-caffeine, [MAR Hold] methocarbamol, [MAR Hold] ondansetron (ZOFRAN) IV, [MAR Hold] promethazine, sodium chloride flush   Vital Signs    Vitals:   03/31/20 2002 04/01/20 0001 04/01/20 0416 04/01/20 0749  BP: 114/76  130/84 128/69  Pulse: 65  (!) 49 (!) 46  Resp: 20   20  Temp: 98.9 F (37.2 C)  97.8 F (36.6 C)   TempSrc: Oral   Oral  SpO2: 97%  100%   Weight:  77.3 kg  77.3 kg  Height:    5\' 3"  (1.6 m)    Intake/Output Summary (Last 24 hours) at 04/01/2020 0753 Last data filed at 04/01/2020 0300 Gross per 24 hour  Intake 728.26 ml  Output --  Net 728.26 ml   Last 3 Weights 04/01/2020 04/01/2020 03/31/2020  Weight (lbs) 170 lb 6.7 oz 170 lb 6.4 oz 171 lb 14.4 oz  Weight (kg) 77.3 kg 77.293 kg 77.973 kg      Telemetry    NSR and sinus bradycardia  - Personally Reviewed  ECG    No new tracing  Physical Exam   GEN: Appears anxious.  Neck: No JVD Cardiac: Bradycardic but regular, no murmurs, rubs, or gallops.  Respiratory: Clear to auscultation bilaterally. GI: Soft, nontender, non-distended  MS: No edema; No deformity. Neuro:  Nonfocal  Psych: Labile affect.  Labs    High Sensitivity Troponin:   Recent Labs  Lab 03/25/20 1510 03/29/20 1523 03/29/20 1904  TROPONINIHS 3 <2 <2      Chemistry Recent Labs  Lab 03/25/20 1510 03/29/20 1523 03/30/20 0059  NA 136 136 139  K 4.0 4.1 3.6  CL 106 105 106  CO2 20* 22 25  GLUCOSE 87 99 96  BUN 10 11 10   CREATININE 0.77 0.92 0.92  CALCIUM 8.5* 8.7* 8.9  GFRNONAA >60 >60 >60  GFRAA >60 >60 >60  ANIONGAP 10 9 8      Hematology Recent Labs  Lab 03/30/20 0059 03/31/20 0012 04/01/20 0419  WBC 11.0* 11.2* 10.8*  RBC 3.71* 3.79* 3.89  HGB 11.5* 11.8* 12.5  HCT 34.7* 35.6* 35.9*  MCV 93.5 93.9 92.3  MCH 31.0 31.1 32.1  MCHC 33.1 33.1 34.8  RDW 12.9 12.9 12.9  PLT 283  268 285    BNPNo results for input(s): BNP, PROBNP in the last 168 hours.   DDimer No results for input(s): DDIMER in the last 168 hours.   Radiology    ECHOCARDIOGRAM COMPLETE  Result Date: 03/30/2020    ECHOCARDIOGRAM REPORT   Patient Name:   Madison Carroll Date of Exam: 03/30/2020 Medical Rec #:  [id number]     Height:       63.0 in Accession #:    [id number]    Weight:       172.1 lb Date of Birth:  05/24/1966    BSA:          1.814 m Patient Age:    54 years      BP:           109/70 mmHg Patient Gender: F             HR:           44 bpm. Exam Location:  [facility] Procedure: 2D Echo Indications:     Chest Pain  History:         Patient has no prior history of Echocardiogram examinations. Hx                  of Mitral Valve Prolapse.  Sonographer:     L Thornton-Maynard Referring Phys:  [id number] Alford Highland Diagnosing Phys: Julien Nordmann MD IMPRESSIONS  1. Left ventricular ejection fraction, by  estimation, is 55%. The left ventricle has normal function. Select images concerning for hypokinesis of the basal anterior and anteroseptal region. Left ventricular diastolic parameters are consistent with Grade  II diastolic dysfunction (pseudonormalization).  2. Right ventricular systolic function is normal. The right ventricular size is normal. There is normal pulmonary artery systolic pressure. The estimated right ventricular systolic pressure is 30.2 mmHg.  3. Tricuspid valve regurgitation is mild to moderate.  4. Aortic valve regurgitation is mild. FINDINGS  Left Ventricle: Left ventricular ejection fraction, by estimation, is 55 to 60%. The left ventricle has normal function. The left ventricle has no regional wall motion abnormalities. The left ventricular internal cavity size was normal in size. There is  no left ventricular hypertrophy. Left ventricular diastolic parameters are consistent with Grade II diastolic dysfunction (pseudonormalization). Right Ventricle: The right ventricular size is normal. No increase in right ventricular wall thickness. Right ventricular systolic function is normal. There is normal pulmonary artery systolic pressure. The tricuspid regurgitant velocity is 2.25 m/s, and  with an assumed right atrial pressure of 10 mmHg, the estimated right ventricular systolic pressure is 30.2 mmHg. Left Atrium: Left atrial size was normal in size. Right Atrium: Right atrial size was normal in size. Pericardium: There is no evidence of pericardial effusion. Mitral Valve: The mitral valve is normal in structure. Normal mobility of the mitral valve leaflets. Mild mitral valve regurgitation. No evidence of mitral valve stenosis. Tricuspid Valve: The tricuspid valve is normal in structure. Tricuspid valve regurgitation is mild to moderate. No evidence of tricuspid stenosis. Aortic Valve: The aortic valve is normal in structure. Aortic valve regurgitation is mild. No aortic stenosis is present. Aortic  valve mean gradient measures 6.0 mmHg. Aortic valve peak gradient measures 12.2 mmHg. Aortic valve area, by VTI measures 1.75  cm. Pulmonic Valve: The pulmonic valve was normal in structure. Pulmonic valve regurgitation is not visualized. No evidence of pulmonic stenosis. Aorta: The aortic root is normal in size and structure. Venous: The inferior vena cava is normal in size with greater than  50% respiratory variability, suggesting right atrial pressure of 3 mmHg. IAS/Shunts: No atrial level shunt detected by color flow Doppler.  LEFT VENTRICLE PLAX 2D LVIDd:         5.45 cm  Diastology LVIDs:         3.81 cm  LV e' lateral:   8.27 cm/s LV PW:         0.97 cm  LV E/e' lateral: 11.5 LV IVS:        0.82 cm  LV e' medial:    5.66 cm/s LVOT diam:     2.00 cm  LV E/e' medial:  16.7 LV SV:         75 LV SV Index:   41 LVOT Area:     3.14 cm  RIGHT VENTRICLE RV S prime:     7.18 cm/s TAPSE (M-mode): 1.4 cm LEFT ATRIUM             Index LA diam:        3.40 cm 1.87 cm/m LA Vol (A2C):   53.4 ml 29.44 ml/m LA Vol (A4C):   51.6 ml 28.44 ml/m LA Biplane Vol: 52.4 ml 28.89 ml/m  AORTIC VALVE                    PULMONIC VALVE AV Area (Vmax):    1.75 cm     PV Vmax:       0.99 m/s AV Area (Vmean):   1.80 cm     PV Peak grad:  3.9 mmHg AV Area (VTI):     1.75 cm AV Vmax:           175.00 cm/s AV Vmean:          113.000 cm/s AV VTI:            0.427 m AV Peak Grad:      12.2 mmHg AV Mean Grad:      6.0 mmHg LVOT Vmax:         97.30 cm/s LVOT Vmean:        64.900 cm/s LVOT VTI:          0.238 m LVOT/AV VTI ratio: 0.56  AORTA Ao Root diam: 3.30 cm MITRAL VALVE               TRICUSPID VALVE MV Area (PHT): 3.12 cm    TR Peak grad:   20.2 mmHg MV Decel Time: 243 msec    TR Vmax:        225.00 cm/s MV E velocity: 94.70 cm/s MV A velocity: 78.40 cm/s  SHUNTS MV E/A ratio:  1.21        Systemic VTI:  0.24 m                            Systemic Diam: 2.00 cm Timothy Gollan MD Electronically signed by Timothy Gollan MD Signature  Date/Time: 03/30/2020/10:36:49 PM    Final     Cardiac Studies   TTE (03/30/20): 1. Left ventricular ejection fraction, by estimation, is 55%. The left  ventricle has normal function. Select images concerning for hypokinesis of  the basal anterior and anteroseptal region. Left ventricular diastolic  parameters are consistent with Grade  II diastolic dysfunction (pseudonormalization).  2. Right ventricular systolic function is normal. The right ventricular  size is normal. There is normal pulmonary artery systolic pressure. The  estimated right ventricular systolic pressure is 30.2 mmHg.  3.   Tricuspid valve regurgitation is mild to moderate.  4. Aortic valve regurgitation is mild.   Patient Profile     54 y.o. female with history of mitral valve prolapse and tobacco use, family history of premature CAD, hospitalized with symptoms of unstable angina.  Assessment & Plan    Unstable angina: Continued chest pain since admission.  Troponin negative but echo with possible anteroseptal hypokinesis.  Recent CTA chest also notable for extensive coronary artery calcification.  Proceed with cardiac catheterization with possible PCI this morning.  I have reviewed the risks, indications, and alternatives to cardiac catheterization, possible angioplasty, and stenting with the patient. Risks include but are not limited to bleeding, infection, vascular injury, stroke, myocardial infection, arrhythmia, kidney injury, radiation-related injury in the case of prolonged fluoroscopy use, emergency cardiac surgery, and death. The patient understands the risks of serious complication is 1-2 in 1000 with diagnostic cardiac cath and 1-2% or less with angioplasty/stenting.  The patient is willing to proceed.  In the meantime, continue heparin infusion, aspirin, and atorvastatin.  Discontinue metoprolol given resting bradycardia into the 40's.  Hyperlipidemia:  Continue atorvastatin.  Tobacco  abuse:  Cessation encouraged.  For questions or updates, please contact [facility] Please consult [email or web address] for contact info under [facility].     Signed, Yvonne Kendall, MD  04/01/2020, 7:53 AM

## 2020-04-01 NOTE — Progress Notes (Signed)
Dr. Okey Dupre at bedside speaking with pt. & her mother re: heart cath results. Both verbalize understanding of conversation

## 2020-04-01 NOTE — Care Management (Signed)
    April 01, 2020  Patient:  Madison Carroll Date of Birth:  December 15, 1965 Date of Visit:  03/29/2020  To Whom it May Concern:  Madison Carroll was seen at [facility] on 03/29/2020.  She will be discharged on 04/01/20.  She is able to return to work from a medical standpoint.  She should avoid heavy lifting and strenuous activity for 5 days.  Sincerely,  Lolita Patella MD [facility]

## 2020-04-01 NOTE — Progress Notes (Signed)
Discussed discharge instructions with patient and mother including, follow up appointments, medications and radial site care.   Gave Handout for radial site care.    Emphasized importance of not using Right arm for the next 24-48hrs; monitor for bleeding and infection and keeping area dry.   Told patient to leave dressing on for next 24-48 hrs.     Instruction sent home with patient/family Work note was also given

## 2020-04-01 NOTE — Progress Notes (Signed)
ANTICOAGULATION CONSULT NOTE  Pharmacy Consult for Heparin  Indication: chest pain/ACS  Allergies  Allergen Reactions  . Metoclopramide Hives and Other (See Comments)  . Codeine   . Sulfa Antibiotics   . Zofran [Ondansetron Hcl] Hives    Patient Measurements: Height: 5\' 3"  (160 cm) Weight: 77.3 kg (170 lb 6.4 oz) IBW/kg (Calculated) : 52.4 Heparin Dosing Weight: 71.6 kg   Vital Signs: Temp: 97.8 F (36.6 C) (07/19 0416) Temp Source: Oral (07/18 2002) BP: 130/84 (07/19 0416) Pulse Rate: 49 (07/19 0416)  Labs: Recent Labs    03/29/20 1523 03/29/20 1523 03/29/20 1904 03/30/20 0059 03/30/20 0746 03/31/20 0012 03/31/20 0502 04/01/20 0419  HGB 12.5   < >  --  11.5*  --  11.8*  --  12.5  HCT 35.0*   < >  --  34.7*  --  35.6*  --  35.9*  PLT 297   < >  --  283  --  268  --  285  APTT  --   --  36  --   --   --   --   --   LABPROT  --   --  11.8  --   --   --   --   --   INR  --   --  0.9  --   --   --   --   --   HEPARINUNFRC  --   --   --  0.17*   < > 0.48 0.51 0.51  CREATININE 0.92  --   --  0.92  --   --   --   --   TROPONINIHS <2  --  <2  --   --   --   --   --    < > = values in this interval not displayed.    Estimated Creatinine Clearance: 68.9 mL/min (by C-G formula based on SCr of 0.92 mg/dL).   Medical History: Past Medical History:  Diagnosis Date  . Anxiety   . Bipolar 1 disorder (HCC)   . Mitral valve prolapse   . TMJ (dislocation of temporomandibular joint)      Assessment: Pharmacy consulted to dose heparin in this 54 year old female with ACS/NSTEMI.  CrCl = 71.1 ml/min No prior anticoag noted.   Goal of Therapy:  Heparin level 0.3-0.7 units/ml Monitor platelets by anticoagulation protocol: Yes   Plan: Heparin level is therapeutic. Will continue current rate and will recheck HL w/ am labs, CBC stable will continue to monitor.  56, PharmD, BCPS Clinical Pharmacist 04/01/2020 7:33 AM

## 2020-04-01 NOTE — Progress Notes (Addendum)
Patient returned from procedure.   Check cath site prior to nurse leaving.   Cath site looks good, level 0;  Patient is A&O x4.   Report some slight pain at site and 9 out of 10 chest pain (radiating to back).    Paged MD-  MD aware.  Ok to give toradol.

## 2020-04-04 NOTE — Progress Notes (Deleted)
Cardiology Office Note    Date:  04/04/2020   ID:  Madison Carroll, DOB [birth year], MRN [id number]  PCP:  Madison Cookey, FNP  Cardiologist:  Madison Kendall, MD  Electrophysiologist:  None   Chief Complaint: Hospital follow-up  History of Present Illness:   Madison Carroll is a 54 y.o. female with history of nonobstructive CAD as outlined below, reported MVP, HLD, anxiety, bipolar disorder, and tobacco use who presents for hospital follow-up after recent admission to [facility] from 7/16 through 7/19 as detailed below.  She was seen in the ED on 03/25/2020 with complaints of chest pain, shortness of breath, and headaches.  EKG was unrevealing.  High-sensitivity troponin was negative x1.  CTA of the chest was negative for PE though extensive coronary artery calcifications were noted.  Admission was recommended though refused by the patient.  In this setting, she was seen as a new patient in the office on 03/29/2020 with continued complaints of frequent chest pain that began 1 week prior and was worsened with activity described as substernal in nature and radiated to the left arm with associated shortness of breath.  She reported having previously undergone a stress test approximately 15 years prior for chest pain though did not recall if that pain was similar to what she was currently experiencing.  She continued to have 8 out of 10 chest pressure at that time.  In this setting, she was sent to the ED and admitted to [facility] from 7/16 through 7/19 for chest pain.  Cardiac enzymes negative x2.  Covid negative.  Echo on 03/30/2020 showed an EF of 55%, select images concerning for hypokinesis of the basal anterior and anteroseptal region, grade 2 diastolic dysfunction, normal RV systolic function and RV cavity size, normal PASP, mild to moderate tricuspid regurgitation, mild aortic insufficiency.  Diagnostic LHC on 04/01/2020 showed mild to moderate nonobstructive CAD.  The most severe lesion was a 60% stenosis  involving the mid LCx which was not significant by iFR with a ratio of 1.0.  Mildly elevated LV filling pressure was noted.  Medical therapy was recommended.  ***   Labs independently reviewed: 03/2020: - Hgb 12, PLT 285, potassium 3.6, BUN 10, serum creatinine 0.92, TC 211, TG 124, HDL 51, LDL 135 07/2018 - albumin 4.7, AST/ALT normal 12/2015 - TSH 0.228, free T4 1.26  Past Medical History:  Diagnosis Date  . Anxiety   . Bipolar 1 disorder (HCC)   . Mitral valve prolapse   . TMJ (dislocation of temporomandibular joint)     Past Surgical History:  Procedure Laterality Date  . ABDOMINAL HYSTERECTOMY    . ABLATION ON ENDOMETRIOSIS    . INTRAVASCULAR PRESSURE WIRE/FFR STUDY N/A 04/01/2020   Procedure: INTRAVASCULAR PRESSURE WIRE/FFR STUDY;  Surgeon: Madison Kendall, MD;  Location: [facility] INVASIVE CV LAB;  Service: Cardiovascular;  Laterality: N/A;  . KNEE SURGERY Right   . LAPAROSCOPIC CHOLECYSTECTOMY    . LEFT HEART CATH AND CORONARY ANGIOGRAPHY N/A 04/01/2020   Procedure: LEFT HEART CATH AND CORONARY ANGIOGRAPHY;  Surgeon: Madison Kendall, MD;  Location: [facility] INVASIVE CV LAB;  Service: Cardiovascular;  Laterality: N/A;    Current Medications: No outpatient medications have been marked as taking for the 04/15/20 encounter (Appointment) with Sondra Barges, PA-C.    Allergies:   Metoclopramide, Codeine, Sulfa antibiotics, and Zofran [ondansetron hcl]   Social History   Socioeconomic History  . Marital status: Divorced    Spouse name: Not on file  . Number of children: Not on  file  . Years of education: Not on file  . Highest education level: Not on file  Occupational History  . Not on file  Tobacco Use  . Smoking status: Current Every Day Smoker    Packs/day: 0.75    Types: Cigarettes  . Smokeless tobacco: Never Used  Vaping Use  . Vaping Use: Never used  Substance and Sexual Activity  . Alcohol use: No  . Drug use: No  . Sexual activity: Yes    Birth control/protection:  None  Other Topics Concern  . Not on file  Social History Narrative  . Not on file   Social Determinants of Health   Financial Resource Strain:   . Difficulty of Paying Living Expenses:   Food Insecurity:   . Worried About Programme researcher, broadcasting/film/video in the Last Year:   . Barista in the Last Year:   Transportation Needs:   . Freight forwarder (Medical):   Marland Kitchen Lack of Transportation (Non-Medical):   Physical Activity:   . Days of Exercise per Week:   . Minutes of Exercise per Session:   Stress:   . Feeling of Stress :   Social Connections:   . Frequency of Communication with Friends and Family:   . Frequency of Social Gatherings with Friends and Family:   . Attends Religious Services:   . Active Member of Clubs or Organizations:   . Attends Banker Meetings:   Marland Kitchen Marital Status:      Family History:  The patient's family history includes Breast cancer (age of onset: 87) in her mother; Coronary artery disease in her mother; Heart attack (age of onset: 70) in her father; Heart failure in her mother.  ROS:   ROS   EKGs/Labs/Other Studies Reviewed:    Studies reviewed were summarized above. The additional studies were reviewed today:  2D echo 03/2020: 1. Left ventricular ejection fraction, by estimation, is 55%. The left  ventricle has normal function. Select images concerning for hypokinesis of  the basal anterior and anteroseptal region. Left ventricular diastolic  parameters are consistent with Grade  II diastolic dysfunction (pseudonormalization).  2. Right ventricular systolic function is normal. The right ventricular  size is normal. There is normal pulmonary artery systolic pressure. The  estimated right ventricular systolic pressure is 30.2 mmHg.  3. Tricuspid valve regurgitation is mild to moderate.  4. Aortic valve regurgitation is mild. __________  LHC 03/2020: Conclusions: 1. Mild to moderate, nonobstructive coronary artery disease.   Most severe lesion is a 60% stenosis involving the mid LCx, which is not hemodynamically significant (iFR = 1.0). 2. Mildly elevated left ventricular filling pressure.  Recommendations: 1. Medical therapy and risk factor modification to prevent progression of disease.  This includes aspirin and statin therapy as well as tobacco cessation. 2. Ongoing management of persistent chest pain, which is likely noncardiac in nature, per primary team. 3. If chest pain is under better control and there are no immediate post catheterization complications, it would be reasonable to discharge Ms. Oman later today and arrange for follow-up in our office in 2 weeks.    EKG:  EKG is ordered today.  The EKG ordered today demonstrates ***  Recent Labs: 03/30/2020: BUN 10; Creatinine, Ser 0.92; Potassium 3.6; Sodium 139 04/01/2020: Hemoglobin 12.5; Platelets 285  Recent Lipid Panel    Component Value Date/Time   CHOL 211 (H) 03/30/2020 0059   TRIG 124 03/30/2020 0059   HDL 51 03/30/2020 0059   CHOLHDL  4.1 03/30/2020 0059   VLDL 25 03/30/2020 0059   LDLCALC 135 (H) 03/30/2020 0059    PHYSICAL EXAM:    VS:  There were no vitals taken for this visit.  BMI: There is no height or weight on file to calculate BMI.  Physical Exam  Wt Readings from Last 3 Encounters:  04/01/20 170 lb 6.7 oz (77.3 kg)  03/29/20 174 lb 4 oz (79 kg)  03/25/20 162 lb (73.5 kg)     ASSESSMENT & PLAN:   1. Nonobstructive CAD: ***  2. HLD: LDL 135 from 03/2020. ***  3. Reported MPV: ***  4. Tobacco use:***   Disposition: F/u with Dr. Okey Dupre or an APP in ***.   Medication Adjustments/Labs and Tests Ordered: Current medicines are reviewed at length with the patient today.  Concerns regarding medicines are outlined above. Medication changes, Labs and Tests ordered today are summarized above and listed in the Patient Instructions accessible in Encounters.   Signed, Eula Listen, PA-C 04/04/2020 3:22 PM     [facility]  - [place] [address] [place], [place] [address] [phone]

## 2020-04-15 ENCOUNTER — Ambulatory Visit: Payer: Self-pay | Admitting: Physician Assistant

## 2020-04-17 ENCOUNTER — Ambulatory Visit: Payer: Self-pay | Admitting: Internal Medicine

## 2020-04-17 NOTE — Progress Notes (Deleted)
Follow-up Outpatient Visit Date: 04/17/2020  Primary Care Provider: Gennette Pac, [place] [address] [place] [address]  Chief Complaint: ***  HPI:  Madison Carroll is a 54 y.o. female with history of nonobstructive coronary artery disease, mitral valve prolapse, anxiety, bipolar disorder, and tobacco use who presents for follow-up of chest pain.  I met Madison Carroll last month after she presented to the ED complaining of chest pain, shortness of breath, and headaches.  She was readmitted out of concern for unstable angina and underwent coronary angiography on 04/01/2020.  This showed mild to moderate, nonobstructive coronary artery disease.  --------------------------------------------------------------------------------------------------  Cardiovascular History & Procedures: Cardiovascular Problems:  Chest pain with nonobstructive coronary artery disease  Risk Factors:  Tobacco use  Cath/PCI:  LHC (04/01/2020): LMCA normal.  LAD with 30% proximal to mid vessel stenosis.  LCx with 60% mid vessel stenosis that is not hemodynamically significant (iFR 1.0).  RCA with mild luminal irregularities.  Mildly elevated left ventricular filling pressure (LVEDP 15-20 mmHg).  CV Surgery:  None  EP Procedures and Devices:  None  Non-Invasive Evaluation(s):  Transthoracic echocardiogram (03/30/2020): Normal LV size.  LVEF 55% with possible hypokinesis of the basal anterior and anteroseptal segments.  Grade 2 diastolic dysfunction.  Normal RV size and function.  Mild to moderate tricuspid regurgitation.  Mild aortic regurgitation.  Normal pulmonary artery pressure.  Dobutamine myocardial perfusion stress test (07/10/2005): No evidence of ischemia or scar.  Recent CV Pertinent Labs: Lab Results  Component Value Date   CHOL 211 (H) 03/30/2020   HDL 51 03/30/2020   LDLCALC 135 (H) 03/30/2020   TRIG 124 03/30/2020   CHOLHDL 4.1 03/30/2020   INR 0.9 03/29/2020   K 3.6 03/30/2020   K  4.0 12/09/2014   BUN 10 03/30/2020   BUN 16 12/09/2014   CREATININE 0.92 03/30/2020   CREATININE 0.75 12/09/2014    Past medical and surgical history were reviewed and updated in EPIC.  No outpatient medications have been marked as taking for the 04/17/20 encounter (Appointment) with Kynzlie Hilleary, Harrell Gave, MD.    Allergies: Metoclopramide, Codeine, Sulfa antibiotics, and Zofran [ondansetron hcl]  Social History   Tobacco Use  . Smoking status: Current Every Day Smoker    Packs/day: 0.75    Types: Cigarettes  . Smokeless tobacco: Never Used  Vaping Use  . Vaping Use: Never used  Substance Use Topics  . Alcohol use: No  . Drug use: No    Family History  Problem Relation Age of Onset  . Breast cancer Mother 26  . Coronary artery disease Mother   . Heart failure Mother   . Heart attack Father 33    Review of Systems: A 12-system review of systems was performed and was negative except as noted in the HPI.  --------------------------------------------------------------------------------------------------  Physical Exam: There were no vitals taken for this visit.  General:  *** HEENT: No conjunctival pallor or scleral icterus. Facemask in place. Neck: Supple without lymphadenopathy, thyromegaly, JVD, or HJR. Lungs: Normal work of breathing. Clear to auscultation bilaterally without wheezes or crackles. Heart: Regular rate and rhythm without murmurs, rubs, or gallops. Non-displaced PMI. Abd: Bowel sounds present. Soft, NT/ND without hepatosplenomegaly Ext: No lower extremity edema. Radial, PT, and DP pulses are 2+ bilaterally. Skin: Warm and dry without rash.  EKG:  ***  Lab Results  Component Value Date   WBC 10.8 (H) 04/01/2020   HGB 12.5 04/01/2020   HCT 35.9 (L) 04/01/2020   MCV 92.3 04/01/2020   PLT  285 04/01/2020    Lab Results  Component Value Date   NA 139 03/30/2020   K 3.6 03/30/2020   CL 106 03/30/2020   CO2 25 03/30/2020   BUN 10 03/30/2020    CREATININE 0.92 03/30/2020   GLUCOSE 96 03/30/2020   ALT 16 07/16/2018    Lab Results  Component Value Date   CHOL 211 (H) 03/30/2020   HDL 51 03/30/2020   LDLCALC 135 (H) 03/30/2020   TRIG 124 03/30/2020   CHOLHDL 4.1 03/30/2020    --------------------------------------------------------------------------------------------------  ASSESSMENT AND PLAN: Harrell Gave Sipriano Fendley, MD 04/17/2020 7:26 AM

## 2020-04-18 ENCOUNTER — Encounter: Payer: Self-pay | Admitting: Internal Medicine

## 2021-05-11 ENCOUNTER — Ambulatory Visit: Payer: Self-pay

## 2021-05-11 ENCOUNTER — Emergency Department
Admission: EM | Admit: 2021-05-11 | Discharge: 2021-05-11 | Disposition: A | Discharge status: Home / Self Care | Payer: Self-pay | Attending: Emergency Medicine | Admitting: Emergency Medicine

## 2021-05-11 ENCOUNTER — Encounter: Payer: Self-pay | Admitting: Emergency Medicine

## 2021-05-11 ENCOUNTER — Other Ambulatory Visit: Payer: Self-pay

## 2021-05-11 DIAGNOSIS — H9203 Otalgia, bilateral: Secondary | ICD-10-CM | POA: Insufficient documentation

## 2021-05-11 DIAGNOSIS — F17210 Nicotine dependence, cigarettes, uncomplicated: Secondary | ICD-10-CM | POA: Insufficient documentation

## 2021-05-11 DIAGNOSIS — U071 COVID-19: Secondary | ICD-10-CM | POA: Insufficient documentation

## 2021-05-11 DIAGNOSIS — R110 Nausea: Secondary | ICD-10-CM | POA: Insufficient documentation

## 2021-05-11 LAB — COMPREHENSIVE METABOLIC PANEL
ALT: 15 U/L (ref 0–44)
AST: 25 U/L (ref 15–41)
Albumin: 4.1 g/dL (ref 3.5–5.0)
Alkaline Phosphatase: 74 U/L (ref 38–126)
Anion gap: 11 (ref 5–15)
BUN: 11 mg/dL (ref 6–20)
CO2: 21 mmol/L — ABNORMAL LOW (ref 22–32)
Calcium: 8.9 mg/dL (ref 8.9–10.3)
Chloride: 99 mmol/L (ref 98–111)
Creatinine, Ser: 0.76 mg/dL (ref 0.44–1.00)
GFR, Estimated: >60 mL/min (ref >60)
Glucose, Bld: 104 mg/dL — ABNORMAL HIGH (ref 70–99)
Potassium: 3.5 mmol/L (ref 3.5–5.1)
Sodium: 131 mmol/L — ABNORMAL LOW (ref 135–145)
Total Bilirubin: 0.5 mg/dL (ref 0.3–1.2)
Total Protein: 7.4 g/dL (ref 6.5–8.1)

## 2021-05-11 LAB — URINALYSIS, COMPLETE (UACMP) WITH MICROSCOPIC
Bilirubin Urine: NEGATIVE
Glucose, UA: NEGATIVE mg/dL
Hgb urine dipstick: NEGATIVE
Ketones, ur: 5 mg/dL — AB
Leukocytes,Ua: NEGATIVE
Nitrite: NEGATIVE
Protein, ur: NEGATIVE mg/dL
Specific Gravity, Urine: 1.016 (ref 1.005–1.030)
pH: 5 (ref 5.0–8.0)

## 2021-05-11 LAB — CBC
HCT: 35.5 % — ABNORMAL LOW (ref 36.0–46.0)
Hemoglobin: 12.5 g/dL (ref 12.0–15.0)
MCH: 31.6 pg (ref 26.0–34.0)
MCHC: 35.2 g/dL (ref 30.0–36.0)
MCV: 89.6 fL (ref 80.0–100.0)
Platelets: 196 10*3/uL (ref 150–400)
RBC: 3.96 MIL/uL (ref 3.87–5.11)
RDW: 12.7 % (ref 11.5–15.5)
WBC: 5.6 10*3/uL (ref 4.0–10.5)
nRBC: 0 % (ref 0.0–0.2)

## 2021-05-11 LAB — LIPASE, BLOOD: Lipase: 27 U/L (ref 11–51)

## 2021-05-11 MED ORDER — KETOROLAC TROMETHAMINE 30 MG/ML IJ SOLN
30.0000 mg | Freq: Once | INTRAMUSCULAR | Status: AC
  Administered 2021-05-11: 30 mg via INTRAVENOUS
  Filled 2021-05-11: qty 1

## 2021-05-11 MED ORDER — SODIUM CHLORIDE 0.9 % IV SOLN
12.5000 mg | Freq: Four times a day (QID) | INTRAVENOUS | Status: DC | PRN
  Administered 2021-05-11: 12.5 mg via INTRAVENOUS
  Filled 2021-05-11: qty 12.5

## 2021-05-11 MED ORDER — SODIUM CHLORIDE 0.9 % IV BOLUS
1000.0000 mL | Freq: Once | INTRAVENOUS | Status: AC
  Administered 2021-05-11: 1000 mL via INTRAVENOUS

## 2021-05-11 MED ORDER — DICYCLOMINE HCL 10 MG PO CAPS: 10.0000 mg | ORAL_CAPSULE | Freq: Three times a day (TID) | ORAL | 0 refills | Status: AC

## 2021-05-11 MED ORDER — SODIUM CHLORIDE 0.9 % IV SOLN
12.5000 mg | Freq: Four times a day (QID) | INTRAVENOUS | Status: DC | PRN
  Filled 2021-05-11 (×2): qty 0.5

## 2021-05-11 MED ORDER — DICYCLOMINE HCL 10 MG PO CAPS
10.0000 mg | ORAL_CAPSULE | Freq: Once | ORAL | Status: AC
  Administered 2021-05-11: 10 mg via ORAL
  Filled 2021-05-11: qty 1

## 2021-05-11 MED ORDER — PROMETHAZINE HCL 12.5 MG PO TABS: 12.5000 mg | ORAL_TABLET | Freq: Four times a day (QID) | ORAL | 0 refills | Status: AC | PRN

## 2021-05-11 NOTE — ED Provider Notes (Signed)
[facility]-EMERGENCY DEPARTMENT  ____________________________________________  Time seen: Approximately 4:02 PM  I have reviewed the triage vital signs and the nursing notes.   HISTORY  Chief Complaint Fever, Sore Throat, Cough, Nausea, and Emesis   Historian Patient     HPI Madison Carroll is a 55 y.o. female presents to the emergency department after patient tested positive for COVID-19.  Patient has had ear pain, pharyngitis, body aches, cough and overall weakness.  Patient also reports dizziness.  Patient denies chest pain, chest tightness or shortness of breath.  Patient's mother is sick with similar symptoms.  No other alleviating measures have been attempted.   Past Medical History:  Diagnosis Date   Anxiety    Bipolar 1 disorder (HCC)    Mitral valve prolapse    TMJ (dislocation of temporomandibular joint)      Immunizations up to date:  Yes.     Past Medical History:  Diagnosis Date   Anxiety    Bipolar 1 disorder (HCC)    Mitral valve prolapse    TMJ (dislocation of temporomandibular joint)     Patient Active Problem List   Diagnosis Date Noted   Unstable angina (HCC) 03/29/2020   Chest pain    Anxiety    Dislocation of jaw    Pulmonary nodule    Tobacco abuse     Past Surgical History:  Procedure Laterality Date   ABDOMINAL HYSTERECTOMY     ABLATION ON ENDOMETRIOSIS     INTRAVASCULAR PRESSURE WIRE/FFR STUDY N/A 04/01/2020   Procedure: INTRAVASCULAR PRESSURE WIRE/FFR STUDY;  Surgeon: Yvonne Kendall, MD;  Location: [facility] INVASIVE CV LAB;  Service: Cardiovascular;  Laterality: N/A;   KNEE SURGERY Right    LAPAROSCOPIC CHOLECYSTECTOMY     LEFT HEART CATH AND CORONARY ANGIOGRAPHY N/A 04/01/2020   Procedure: LEFT HEART CATH AND CORONARY ANGIOGRAPHY;  Surgeon: Yvonne Kendall, MD;  Location: [facility] INVASIVE CV LAB;  Service: Cardiovascular;  Laterality: N/A;    Prior to Admission medications   Medication Sig Start Date End Date Taking? Authorizing  Provider  dicyclomine (BENTYL) 10 MG capsule Take 1 capsule (10 mg total) by mouth 4 (four) times daily -  before meals and at bedtime for 5 days. 05/11/21 05/16/21 Yes Pia Mau M, PA-C  promethazine (PHENERGAN) 12.5 MG tablet Take 1 tablet (12.5 mg total) by mouth every 6 (six) hours as needed for up to 3 days for nausea or vomiting. 05/11/21 05/14/21 Yes Pia Mau M, PA-C  alprazolam Prudy Feeler) 2 MG tablet Take 2 mg by mouth 4 (four) times daily -  before meals and at bedtime. 06/29/18   [provider]  aspirin EC 81 MG EC tablet Take 1 tablet (81 mg total) by mouth daily. Swallow whole. 04/01/20   Tresa Moore, MD  atorvastatin (LIPITOR) 40 MG tablet Take 1 tablet (40 mg total) by mouth daily. 04/02/20 05/02/20  Tresa Moore, MD  doxepin (SINEQUAN) 50 MG capsule TAKE 1 TO 2 CAPSULE(S) BY MOUTH EVERY NIGHT AT BEDTIME AS DIRECTED    [provider]  gabapentin (NEURONTIN) 800 MG tablet Take by mouth. TAKE 1 TABLET BY MOUTH TWICE DAILY THEN TAKE 1 TABLET AT BEDTIME AS DIRECTED 03/21/20   [provider]  lamoTRIgine (LAMICTAL) 150 MG tablet TAKE 2 TABLETS BY MOUTH ONCE DAILY 06/29/18   [provider]  nicotine (NICODERM CQ - DOSED IN MG/24 HOURS) 14 mg/24hr patch Place 1 patch (14 mg total) onto the skin daily. 04/02/20   Tresa Moore, MD  Allergies Metoclopramide, Codeine, Sulfa antibiotics, and Zofran [ondansetron hcl]  Family History  Problem Relation Age of Onset   Breast cancer Mother 36   Coronary artery disease Mother    Heart failure Mother    Heart attack Father 42    Social History Social History   Tobacco Use   Smoking status: Every Day    Packs/day: 0.75    Types: Cigarettes   Smokeless tobacco: Never  Vaping Use   Vaping Use: Never used  Substance Use Topics   Alcohol use: No   Drug use: No     Review of Systems  Constitutional: Patient has fever.  Eyes:  No discharge ENT: No upper respiratory  complaints. Respiratory: no cough. No SOB/ use of accessory muscles to breath Gastrointestinal:   No nausea, no vomiting.  No diarrhea.  No constipation. Musculoskeletal: Patient has body aches.  Skin: Negative for rash, abrasions, lacerations, ecchymosis.    ____________________________________________   PHYSICAL EXAM:  VITAL SIGNS: ED Triage Vitals  Enc Vitals Group     BP 05/11/21 1137 99/71     Pulse Rate 05/11/21 1135 89     Resp 05/11/21 1135 18     Temp 05/11/21 1135 99.6 F (37.6 C)     Temp Source 05/11/21 1135 Oral     SpO2 05/11/21 1135 95 %     Weight 05/11/21 1102 171 lb 15.3 oz (78 kg)     Height 05/11/21 1102 5\' 3"  (1.6 m)     Head Circumference --      Peak Flow --      Pain Score 05/11/21 1101 5     Pain Loc --      Pain Edu? --      Excl. in GC? --      Constitutional: Alert and oriented. Patient is lying supine. Eyes: Conjunctivae are normal. PERRL. EOMI. Head: Atraumatic. ENT:      Ears: Tympanic membranes are mildly injected with mild effusion bilaterally.       Nose: No congestion/rhinnorhea.      Mouth/Throat: Mucous membranes are moist. Posterior pharynx is mildly erythematous.  Hematological/Lymphatic/Immunilogical: No cervical lymphadenopathy.  Cardiovascular: Normal rate, regular rhythm. Normal S1 and S2.  Good peripheral circulation. Respiratory: Normal respiratory effort without tachypnea or retractions. Lungs CTAB. Good air entry to the bases with no decreased or absent breath sounds. Gastrointestinal: Bowel sounds 4 quadrants. Soft and nontender to palpation. No guarding or rigidity. No palpable masses. No distention. No CVA tenderness. Musculoskeletal: Full range of motion to all extremities. No gross deformities appreciated. Neurologic:  Normal speech and language. No gross focal neurologic deficits are appreciated.  Skin:  Skin is warm, dry and intact. No rash noted. Psychiatric: Mood and affect are normal. Speech and behavior are  normal. Patient exhibits appropriate insight and judgement.   ____________________________________________   LABS (all labs ordered are listed, but only abnormal results are displayed)  Labs Reviewed  COMPREHENSIVE METABOLIC PANEL - Abnormal; Notable for the following components:      Result Value   Sodium 131 (*)    CO2 21 (*)    Glucose, Bld 104 (*)    All other components within normal limits  CBC - Abnormal; Notable for the following components:   HCT 35.5 (*)    All other components within normal limits  URINALYSIS, COMPLETE (UACMP) WITH MICROSCOPIC - Abnormal; Notable for the following components:   Color, Urine YELLOW (*)    APPearance CLOUDY (*)    Ketones,  ur 5 (*)    Bacteria, UA RARE (*)    All other components within normal limits  LIPASE, BLOOD   ____________________________________________  EKG   ____________________________________________  RADIOLOGY Geraldo Pitter, personally viewed and evaluated these images (plain radiographs) as part of my medical decision making, as well as reviewing the written report by the radiologist.    No results found.  ____________________________________________    PROCEDURES  Procedure(s) performed:     Procedures     Medications  promethazine (PHENERGAN) 12.5 mg in sodium chloride 0.9 % 50 mL IVPB (12.5 mg Intravenous New Bag/Given 05/11/21 1854)  sodium chloride 0.9 % bolus 1,000 mL (0 mLs Intravenous Stopped 05/11/21 1850)  dicyclomine (BENTYL) capsule 10 mg (10 mg Oral Given 05/11/21 1703)  ketorolac (TORADOL) 30 MG/ML injection 30 mg (30 mg Intravenous Given 05/11/21 1704)     ____________________________________________   INITIAL IMPRESSION / ASSESSMENT AND PLAN / ED COURSE  Pertinent labs & imaging results that were available during my care of the patient were reviewed by me and considered in my medical decision making (see chart for details).      Assessment and Plan:   COVID-76 Nausea 55 year old female presents to the emergency department with body aches, headache, ear pain and nausea and vomiting  Vital signs were reassuring at triage.  On physical exam, patient was alert, active and nontoxic-appearing.  CMP indicated mild hyponatremia.  CBC was reassuring.  Urinalysis showed no signs of UTI.  Patient was given supplemental fluids, Toradol, Bentyl and Phenergan in the emergency department.  She reported that some of her symptoms persisted but that her headache had improved.  She was discharged with Phenergan for nausea and vomiting and Bentyl.  I also recommended over-the-counter Flonase and Zyrtec.  Rest and hydration were encouraged at home.  Tylenol and ibuprofen alternating were recommended for headache and fever.     ____________________________________________  FINAL CLINICAL IMPRESSION(S) / ED DIAGNOSES  Final diagnoses:  COVID-19  Nausea      NEW MEDICATIONS STARTED DURING THIS VISIT:  ED Discharge Orders          Ordered    promethazine (PHENERGAN) 12.5 MG tablet  Every 6 hours PRN        05/11/21 1901    dicyclomine (BENTYL) 10 MG capsule  3 times daily before meals & bedtime        05/11/21 1902                This chart was dictated using voice recognition software/Dragon. Despite best efforts to proofread, errors can occur which can change the meaning. Any change was purely unintentional.     Orvil Feil, PA-C 05/11/21 1914    Minna Antis, MD 05/11/21 2213

## 2021-05-11 NOTE — Discharge Instructions (Addendum)
You can take Phenergan ever four hours as needed for nausea and vomiting.  You can take Bentyl for abdominal cramping.  You can take Zyrtec once daily for ear pain.  You can take one spray of Flonase each side to allow middle ears to drain.

## 2021-05-11 NOTE — ED Notes (Signed)
Message sent to pharmacy for phenergan

## 2021-05-11 NOTE — Telephone Encounter (Signed)
Pt tested positive for Covid 19 with home test x 2. Pt sounds very weak and tired over the phone. Pt began having sx last Thursday and was worse on Friday.  Pt stated she is SOB at rest, has cough with yellow phlegm. Pt has asthma.  Pt has a dry mouth, fatigue, sore throat, mid back pain, chills,diarrhea, vomiting, headache "migraine," muscle aches and dizziness.  Last void was last night. Pt trying to drink Gatorade.  Advised pt to be driven to ED ASAP. Care advice given and pt voiced understanding. Called [facility] charge nurse of ED, Vernona Rieger and advised her of triage and name and DOB and that she is positive for covid.       Reason for Disposition  MODERATE difficulty breathing (e.g., speaks in phrases, SOB even at rest, pulse 100-120)  Answer Assessment - Initial Assessment Questions 1. COVID-19 DIAGNOSIS: "Who made your COVID-19 diagnosis?" "Was it confirmed by a positive lab test or self-test?" If not diagnosed by a doctor (or NP/PA), ask "Are there lots of cases (community spread) where you live?" Note: See public health department website, if unsure.     Home test 2. COVID-19 EXPOSURE: "Was there any known exposure to COVID before the symptoms began?" CDC Definition of close contact: within 6 feet (2 meters) for a total of 15 minutes or more over a 24-hour period.      no 3. ONSET: "When did the COVID-19 symptoms start?"      Thursday 4. WORST SYMPTOM: "What is your worst symptom?" (e.g., cough, fever, shortness of breath, muscle aches)     Migraine muscle aches and diarrhea 5. COUGH: "Do you have a cough?" If Yes, ask: "How bad is the cough?"       Yes-productive- yellow phlegm 6. FEVER: "Do you have a fever?" If Yes, ask: "What is your temperature, how was it measured, and when did it start?"     No thermometer 7. RESPIRATORY STATUS: "Describe your breathing?" (e.g., shortness of breath, wheezing, unable to speak)      SOB at rest  8. BETTER-SAME-WORSE: "Are you getting better,  staying the same or getting worse compared to yesterday?"  If getting worse, ask, "In what way?"     worse 9. HIGH RISK DISEASE: "Do you have any chronic medical problems?" (e.g., asthma, heart or lung disease, weak immune system, obesity, etc.)     asthma 10. VACCINE: "Have you had the COVID-19 vaccine?" If Yes, ask: "Which one, how many shots, when did you get it?"       no 11. BOOSTER: "Have you received your COVID-19 booster?" If Yes, ask: "Which one and when did you get it?"       no 12. PREGNANCY: "Is there any chance you are pregnant?" "When was your last menstrual period?"       no 13. OTHER SYMPTOMS: "Do you have any other symptoms?"  (e.g., chills, fatigue, headache, loss of smell or taste, muscle pain, sore throat)       SOB at rest, vomiting, diarrhea, fatigue, chills, headache,mid back pain,sore throat and muscle aches 14. O2 SATURATION MONITOR:  "Do you use an oxygen saturation monitor (pulse oximeter) at home?" If Yes, ask "What is your reading (oxygen level) today?" "What is your usual oxygen saturation reading?" (e.g., 95%)       no  Protocols used: Coronavirus (COVID-19) Diagnosed or Suspected-A-AH

## 2021-05-11 NOTE — ED Triage Notes (Signed)
Pt reports tested positive for COVID on Friday and since then has ran fevers, had bodyaches, sore throat, cough, NV and overall weakness.

## 2021-09-01 ENCOUNTER — Other Ambulatory Visit: Payer: Self-pay

## 2021-09-01 ENCOUNTER — Emergency Department
Admission: EM | Admit: 2021-09-01 | Discharge: 2021-09-01 | Disposition: A | Discharge status: Home / Self Care | Payer: Self-pay | Attending: Emergency Medicine | Admitting: Emergency Medicine

## 2021-09-01 ENCOUNTER — Emergency Department: Payer: Self-pay

## 2021-09-01 DIAGNOSIS — Y93K1 Activity, walking an animal: Secondary | ICD-10-CM | POA: Insufficient documentation

## 2021-09-01 DIAGNOSIS — S8262XA Displaced fracture of lateral malleolus of left fibula, initial encounter for closed fracture: Secondary | ICD-10-CM | POA: Insufficient documentation

## 2021-09-01 DIAGNOSIS — Z7982 Long term (current) use of aspirin: Secondary | ICD-10-CM | POA: Insufficient documentation

## 2021-09-01 DIAGNOSIS — Z955 Presence of coronary angioplasty implant and graft: Secondary | ICD-10-CM | POA: Insufficient documentation

## 2021-09-01 DIAGNOSIS — W1842XA Slipping, tripping and stumbling without falling due to stepping into hole or opening, initial encounter: Secondary | ICD-10-CM | POA: Insufficient documentation

## 2021-09-01 DIAGNOSIS — W19XXXA Unspecified fall, initial encounter: Secondary | ICD-10-CM

## 2021-09-01 DIAGNOSIS — F17210 Nicotine dependence, cigarettes, uncomplicated: Secondary | ICD-10-CM | POA: Insufficient documentation

## 2021-09-01 DIAGNOSIS — M25572 Pain in left ankle and joints of left foot: Secondary | ICD-10-CM | POA: Insufficient documentation

## 2021-09-01 DIAGNOSIS — S82832A Other fracture of upper and lower end of left fibula, initial encounter for closed fracture: Secondary | ICD-10-CM

## 2021-09-01 MED ORDER — OXYCODONE-ACETAMINOPHEN 5-325 MG PO TABS: 1.0000 | ORAL_TABLET | ORAL | 0 refills | Status: AC | PRN

## 2021-09-01 MED ORDER — IBUPROFEN 800 MG PO TABS: 800.0000 mg | ORAL_TABLET | Freq: Three times a day (TID) | ORAL | 0 refills | Status: AC | PRN

## 2021-09-01 MED ORDER — IBUPROFEN 800 MG PO TABS
800.0000 mg | ORAL_TABLET | Freq: Once | ORAL | Status: AC
  Administered 2021-09-01: 03:00:00 800 mg via ORAL
  Filled 2021-09-01: qty 1

## 2021-09-01 MED ORDER — OXYCODONE-ACETAMINOPHEN 5-325 MG PO TABS
1.0000 | ORAL_TABLET | Freq: Once | ORAL | Status: AC
  Administered 2021-09-01: 03:00:00 1 via ORAL
  Filled 2021-09-01: qty 1

## 2021-09-01 NOTE — ED Triage Notes (Signed)
Pt states that she fell twice last night and rolled her left ankle, pt states she heard a pop, pt has been able to ambulate after fall but with a lot of pain. Pt has swelling to left ankle.

## 2021-09-01 NOTE — Discharge Instructions (Signed)
1.  Keep splint clean and dry.  Elevate affected area several times daily and apply ice over splint to reduce swelling. 2.  You may take pain medicines as needed (Motrin/Percocet). 3.  Use walker to help you balance as you walk. 4.  Return to the ER for worsening symptoms, persistent vomiting, difficulty breathing or other concerns.

## 2021-09-01 NOTE — ED Provider Notes (Signed)
[facility] Emergency Department Provider Note   ____________________________________________   Event Date/Time   First MD Initiated Contact with Patient 09/01/21 0230     (approximate)  I have reviewed the triage vital signs and the nursing notes.   HISTORY  Chief Complaint Ankle Pain    HPI Madison Carroll is a 55 y.o. female who presents to the ED from home with a chief complaint of left ankle injury.  Patient was walking the dogs last evening when she stepped into hole and twisted her ankle.  Stepped on it and rolled it again and heard a "pop".  Able to ambulate but with significant pain.  Presents with pain and swelling to her left ankle.  Incident occurred approximately 9 PM.  Denies striking head, LOC or neck pain.      Past Medical History:  Diagnosis Date   Anxiety    Bipolar 1 disorder (HCC)    Mitral valve prolapse    TMJ (dislocation of temporomandibular joint)     Patient Active Problem List   Diagnosis Date Noted   Unstable angina (HCC) 03/29/2020   Chest pain    Anxiety    Dislocation of jaw    Pulmonary nodule    Tobacco abuse     Past Surgical History:  Procedure Laterality Date   ABDOMINAL HYSTERECTOMY     ABLATION ON ENDOMETRIOSIS     INTRAVASCULAR PRESSURE WIRE/FFR STUDY N/A 04/01/2020   Procedure: INTRAVASCULAR PRESSURE WIRE/FFR STUDY;  Surgeon: Yvonne Kendall, MD;  Location: [facility] INVASIVE CV LAB;  Service: Cardiovascular;  Laterality: N/A;   KNEE SURGERY Right    LAPAROSCOPIC CHOLECYSTECTOMY     LEFT HEART CATH AND CORONARY ANGIOGRAPHY N/A 04/01/2020   Procedure: LEFT HEART CATH AND CORONARY ANGIOGRAPHY;  Surgeon: Yvonne Kendall, MD;  Location: [facility] INVASIVE CV LAB;  Service: Cardiovascular;  Laterality: N/A;    Prior to Admission medications   Medication Sig Start Date End Date Taking? Authorizing Provider  ibuprofen (ADVIL) 800 MG tablet Take 1 tablet (800 mg total) by mouth every 8 (eight) hours as needed for  moderate pain. 09/01/21  Yes Irean Hong, MD  oxyCODONE-acetaminophen (PERCOCET/ROXICET) 5-325 MG tablet Take 1 tablet by mouth every 4 (four) hours as needed for severe pain. 09/01/21  Yes Irean Hong, MD  alprazolam Prudy Feeler) 2 MG tablet Take 2 mg by mouth 4 (four) times daily -  before meals and at bedtime. 06/29/18   [provider]  aspirin EC 81 MG EC tablet Take 1 tablet (81 mg total) by mouth daily. Swallow whole. 04/01/20   Tresa Moore, MD  atorvastatin (LIPITOR) 40 MG tablet Take 1 tablet (40 mg total) by mouth daily. 04/02/20 05/02/20  Tresa Moore, MD  dicyclomine (BENTYL) 10 MG capsule Take 1 capsule (10 mg total) by mouth 4 (four) times daily -  before meals and at bedtime for 5 days. 05/11/21 05/16/21  Orvil Feil, PA-C  doxepin (SINEQUAN) 50 MG capsule TAKE 1 TO 2 CAPSULE(S) BY MOUTH EVERY NIGHT AT BEDTIME AS DIRECTED    [provider]  gabapentin (NEURONTIN) 800 MG tablet Take by mouth. TAKE 1 TABLET BY MOUTH TWICE DAILY THEN TAKE 1 TABLET AT BEDTIME AS DIRECTED 03/21/20   [provider]  lamoTRIgine (LAMICTAL) 150 MG tablet TAKE 2 TABLETS BY MOUTH ONCE DAILY 06/29/18   [provider]  nicotine (NICODERM CQ - DOSED IN MG/24 HOURS) 14 mg/24hr patch Place 1 patch (14 mg total) onto the  skin daily. 04/02/20   Tresa Moore, MD  promethazine (PHENERGAN) 12.5 MG tablet Take 1 tablet (12.5 mg total) by mouth every 6 (six) hours as needed for up to 3 days for nausea or vomiting. 05/11/21 05/14/21  Orvil Feil, PA-C    Allergies Metoclopramide, Codeine, Sulfa antibiotics, and Zofran [ondansetron hcl]  Family History  Problem Relation Age of Onset   Breast cancer Mother 9   Coronary artery disease Mother    Heart failure Mother    Heart attack Father 65    Social History Social History   Tobacco Use   Smoking status: Every Day    Packs/day: 0.75    Types: Cigarettes   Smokeless tobacco: Never  Vaping Use   Vaping Use:  Never used  Substance Use Topics   Alcohol use: No   Drug use: No    Review of Systems  Constitutional: No fever/chills Eyes: No visual changes. ENT: No sore throat. Cardiovascular: Denies chest pain. Respiratory: Denies shortness of breath. Gastrointestinal: No abdominal pain.  No nausea, no vomiting.  No diarrhea.  No constipation. Genitourinary: Negative for dysuria. Musculoskeletal: Positive for left ankle pain and injury.  Negative for back pain. Skin: Negative for rash. Neurological: Negative for headaches, focal weakness or numbness.   ____________________________________________   PHYSICAL EXAM:  VITAL SIGNS: ED Triage Vitals  Enc Vitals Group     BP 09/01/21 0049 (!) 134/101     Pulse Rate 09/01/21 0049 95     Resp 09/01/21 0049 18     Temp 09/01/21 0049 98.2 F (36.8 C)     Temp Source 09/01/21 0049 Oral     SpO2 09/01/21 0049 97 %     Weight 09/01/21 0047 165 lb (74.8 kg)     Height 09/01/21 0047 5\' 4"  (1.626 m)     Head Circumference --      Peak Flow --      Pain Score 09/01/21 0047 9     Pain Loc --      Pain Edu? --      Excl. in GC? --     Constitutional: Alert and oriented. Well appearing and in mild acute distress. Eyes: Conjunctivae are normal. PERRL. EOMI. Head: Atraumatic. Nose: Atraumatic. Mouth/Throat: Mucous membranes are moist.  No dental malocclusion. Neck: No stridor.  No cervical spine tenderness to palpation. Cardiovascular: Normal rate, regular rhythm. Grossly normal heart sounds.  Good peripheral circulation. Respiratory: Normal respiratory effort.  No retractions. Lungs CTAB. Gastrointestinal: Soft and nontender. No distention. No abdominal bruits. No CVA tenderness. Musculoskeletal:  LLE: Lateral malleolus with mild to moderate swelling.  Limited range of motion secondary to pain.  2+ distal pulses.  Brisk, less than 5-second capillary refill. Neurologic:  Normal speech and language. No gross focal neurologic deficits are  appreciated.  Skin:  Skin is warm, dry and intact. No rash noted. Psychiatric: Mood and affect are normal. Speech and behavior are normal.  ____________________________________________   LABS (all labs ordered are listed, but only abnormal results are displayed)  Labs Reviewed - No data to display ____________________________________________  EKG  None ____________________________________________  RADIOLOGY I, Kimla Furth J, personally viewed and evaluated these images (plain radiographs) as part of my medical decision making, as well as reviewing the written report by the radiologist.  ED MD interpretation: Lateral malleolus fracture with associated soft tissue swelling.  Official radiology report(s): DG Ankle Complete Left  Result Date: 09/01/2021 CLINICAL DATA:  Recent fall, initial encounter EXAM: LEFT ANKLE COMPLETE -  3+ VIEW COMPARISON:  None. FINDINGS: There is a transverse fracture through the distal aspect of the fibula with associated soft tissue swelling. The distal tibia appears within normal limits. Tarsal degenerative changes and calcaneal spurring is seen. No other fractures are noted. IMPRESSION: Lateral malleolar fracture with associated soft tissue swelling. Electronically Signed   By: Alcide Clever M.D.   On: 09/01/2021 01:26    ____________________________________________   PROCEDURES  Procedure(s) performed (including Critical Care):  Procedures   ____________________________________________   INITIAL IMPRESSION / ASSESSMENT AND PLAN / ED COURSE  As part of my medical decision making, I reviewed the following data within the electronic MEDICAL RECORD NUMBER Nursing notes reviewed and incorporated, Radiograph reviewed, Notes from prior ED visits, and [place] Controlled Substance Database     55 year old female who presents with left ankle pain and injury status post mechanical fall.  X-rays demonstrate lateral malleolar fracture of distal fibula.  Will apply OCL  splint, provide walker, Motrin/Percocet and patient will follow up with orthopedics next week.  Strict return precautions given.  Patient verbalizes understanding and agrees with plan of care.      ____________________________________________   FINAL CLINICAL IMPRESSION(S) / ED DIAGNOSES  Final diagnoses:  Fall, initial encounter  Closed fracture of distal end of left fibula, unspecified fracture morphology, initial encounter     ED Discharge Orders          Ordered    ibuprofen (ADVIL) 800 MG tablet  Every 8 hours PRN        09/01/21 0241    oxyCODONE-acetaminophen (PERCOCET/ROXICET) 5-325 MG tablet  Every 4 hours PRN        09/01/21 0241             Note:  This document was prepared using Dragon voice recognition software and may include unintentional dictation errors.    Irean Hong, MD 09/01/21 [phone]
